# Patient Record
Sex: Female | Born: 1975 | Race: Black or African American | Hispanic: No | Marital: Single | Smoking: Never smoker
Health system: Southern US, Community
[De-identification: ages and names within clinical notes are randomized; demographics above are authoritative.]

## PROBLEM LIST (undated history)

## (undated) DIAGNOSIS — E669 Obesity, unspecified: Secondary | ICD-10-CM

## (undated) DIAGNOSIS — I10 Essential (primary) hypertension: Secondary | ICD-10-CM

## (undated) DIAGNOSIS — E119 Type 2 diabetes mellitus without complications: Secondary | ICD-10-CM

## (undated) DIAGNOSIS — E785 Hyperlipidemia, unspecified: Secondary | ICD-10-CM

## (undated) DIAGNOSIS — M19072 Primary osteoarthritis, left ankle and foot: Secondary | ICD-10-CM

## (undated) HISTORY — PX: CHOLECYSTECTOMY: SHX55

## (undated) HISTORY — DX: Obesity, unspecified: E66.9

## (undated) HISTORY — DX: Primary osteoarthritis, left ankle and foot: M19.072

## (undated) HISTORY — DX: Hyperlipidemia, unspecified: E78.5

## (undated) HISTORY — PX: HERNIA REPAIR: SHX51

## (undated) HISTORY — PX: ANKLE SURGERY: SHX546

---
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
## 1998-02-21 ENCOUNTER — Emergency Department (HOSPITAL_COMMUNITY): Admission: EM | Admit: 1998-02-21 | Discharge: 1998-02-21 | Payer: Self-pay | Admitting: Emergency Medicine

## 1998-02-21 ENCOUNTER — Encounter: Payer: Self-pay | Admitting: Emergency Medicine

## 1998-03-06 ENCOUNTER — Ambulatory Visit (HOSPITAL_COMMUNITY): Admission: RE | Admit: 1998-03-06 | Discharge: 1998-03-07 | Payer: Self-pay

## 1999-09-10 ENCOUNTER — Inpatient Hospital Stay (HOSPITAL_COMMUNITY): Admission: AD | Admit: 1999-09-10 | Discharge: 1999-09-10 | Payer: Self-pay | Admitting: Obstetrics

## 2000-01-04 ENCOUNTER — Emergency Department (HOSPITAL_COMMUNITY): Admission: EM | Admit: 2000-01-04 | Discharge: 2000-01-04 | Payer: Self-pay | Admitting: Emergency Medicine

## 2000-01-04 ENCOUNTER — Encounter: Payer: Self-pay | Admitting: Emergency Medicine

## 2000-03-11 HISTORY — PX: ABDOMINAL EXPLORATION SURGERY: SHX538

## 2000-03-11 HISTORY — PX: TUBAL LIGATION: SHX77

## 2000-10-06 ENCOUNTER — Other Ambulatory Visit: Admission: RE | Admit: 2000-10-06 | Discharge: 2000-10-06 | Payer: Self-pay | Admitting: Obstetrics & Gynecology

## 2000-12-26 ENCOUNTER — Encounter: Admission: RE | Admit: 2000-12-26 | Discharge: 2001-03-26 | Payer: Self-pay | Admitting: Obstetrics and Gynecology

## 2001-01-15 ENCOUNTER — Inpatient Hospital Stay (HOSPITAL_COMMUNITY): Admission: AD | Admit: 2001-01-15 | Discharge: 2001-01-15 | Payer: Self-pay | Admitting: Family Medicine

## 2001-01-16 ENCOUNTER — Inpatient Hospital Stay (HOSPITAL_COMMUNITY): Admission: AD | Admit: 2001-01-16 | Discharge: 2001-01-16 | Payer: Self-pay | Admitting: Obstetrics and Gynecology

## 2001-01-26 ENCOUNTER — Inpatient Hospital Stay (HOSPITAL_COMMUNITY): Admission: AD | Admit: 2001-01-26 | Discharge: 2001-01-29 | Payer: Self-pay | Admitting: Obstetrics & Gynecology

## 2001-03-17 ENCOUNTER — Ambulatory Visit (HOSPITAL_COMMUNITY): Admission: RE | Admit: 2001-03-17 | Discharge: 2001-03-17 | Payer: Self-pay | Admitting: Obstetrics & Gynecology

## 2001-03-20 ENCOUNTER — Encounter: Payer: Self-pay | Admitting: Obstetrics & Gynecology

## 2001-03-21 ENCOUNTER — Inpatient Hospital Stay (HOSPITAL_COMMUNITY): Admission: AD | Admit: 2001-03-21 | Discharge: 2001-03-25 | Payer: Self-pay | Admitting: Obstetrics and Gynecology

## 2001-03-22 ENCOUNTER — Encounter: Payer: Self-pay | Admitting: General Surgery

## 2002-02-25 ENCOUNTER — Emergency Department (HOSPITAL_COMMUNITY): Admission: EM | Admit: 2002-02-25 | Discharge: 2002-02-25 | Payer: Self-pay | Admitting: Emergency Medicine

## 2004-01-25 ENCOUNTER — Emergency Department (HOSPITAL_COMMUNITY): Admission: EM | Admit: 2004-01-25 | Discharge: 2004-01-26 | Payer: Self-pay

## 2004-04-04 ENCOUNTER — Ambulatory Visit: Payer: Self-pay | Admitting: Internal Medicine

## 2004-06-04 ENCOUNTER — Ambulatory Visit: Payer: Self-pay | Admitting: Internal Medicine

## 2004-06-05 ENCOUNTER — Ambulatory Visit: Payer: Self-pay | Admitting: *Deleted

## 2004-07-06 ENCOUNTER — Ambulatory Visit: Payer: Self-pay | Admitting: Internal Medicine

## 2004-07-06 DIAGNOSIS — A5901 Trichomonal vulvovaginitis: Secondary | ICD-10-CM | POA: Insufficient documentation

## 2004-07-06 HISTORY — DX: Trichomonal vulvovaginitis: A59.01

## 2004-07-12 ENCOUNTER — Ambulatory Visit: Payer: Self-pay | Admitting: Internal Medicine

## 2004-10-31 ENCOUNTER — Ambulatory Visit: Payer: Self-pay | Admitting: Internal Medicine

## 2004-12-01 ENCOUNTER — Emergency Department (HOSPITAL_COMMUNITY): Admission: EM | Admit: 2004-12-01 | Discharge: 2004-12-02 | Payer: Self-pay | Admitting: Emergency Medicine

## 2005-07-09 ENCOUNTER — Emergency Department (HOSPITAL_COMMUNITY): Admission: EM | Admit: 2005-07-09 | Discharge: 2005-07-10 | Payer: Self-pay | Admitting: Emergency Medicine

## 2006-02-07 ENCOUNTER — Emergency Department (HOSPITAL_COMMUNITY): Admission: EM | Admit: 2006-02-07 | Discharge: 2006-02-07 | Payer: Self-pay | Admitting: Family Medicine

## 2006-10-28 ENCOUNTER — Emergency Department (HOSPITAL_COMMUNITY): Admission: EM | Admit: 2006-10-28 | Discharge: 2006-10-28 | Payer: Self-pay | Admitting: Emergency Medicine

## 2007-01-14 ENCOUNTER — Emergency Department (HOSPITAL_COMMUNITY): Admission: EM | Admit: 2007-01-14 | Discharge: 2007-01-14 | Payer: Self-pay | Admitting: Emergency Medicine

## 2007-06-22 ENCOUNTER — Emergency Department (HOSPITAL_COMMUNITY): Admission: EM | Admit: 2007-06-22 | Discharge: 2007-06-22 | Payer: Self-pay | Admitting: Emergency Medicine

## 2007-09-01 ENCOUNTER — Emergency Department (HOSPITAL_COMMUNITY): Admission: EM | Admit: 2007-09-01 | Discharge: 2007-09-01 | Payer: Self-pay | Admitting: Emergency Medicine

## 2008-02-23 ENCOUNTER — Ambulatory Visit: Payer: Self-pay | Admitting: Internal Medicine

## 2008-02-23 DIAGNOSIS — R82998 Other abnormal findings in urine: Secondary | ICD-10-CM

## 2008-02-23 DIAGNOSIS — M545 Low back pain, unspecified: Secondary | ICD-10-CM | POA: Insufficient documentation

## 2008-02-23 DIAGNOSIS — F341 Dysthymic disorder: Secondary | ICD-10-CM

## 2008-02-23 DIAGNOSIS — I10 Essential (primary) hypertension: Secondary | ICD-10-CM | POA: Insufficient documentation

## 2008-02-23 HISTORY — DX: Low back pain, unspecified: M54.50

## 2008-02-23 HISTORY — DX: Other abnormal findings in urine: R82.998

## 2008-02-23 HISTORY — DX: Dysthymic disorder: F34.1

## 2008-02-23 LAB — CONVERTED CEMR LAB
Bilirubin Urine: NEGATIVE
Ketones, urine, test strip: NEGATIVE
Nitrite: POSITIVE
pH: 8

## 2008-02-24 ENCOUNTER — Encounter (INDEPENDENT_AMBULATORY_CARE_PROVIDER_SITE_OTHER): Payer: Self-pay | Admitting: Internal Medicine

## 2008-02-26 LAB — CONVERTED CEMR LAB
CO2: 23 meq/L (ref 19–32)
Calcium: 9.6 mg/dL (ref 8.4–10.5)
Sodium: 141 meq/L (ref 135–145)

## 2008-03-08 ENCOUNTER — Ambulatory Visit: Payer: Self-pay | Admitting: Internal Medicine

## 2008-03-08 DIAGNOSIS — R7309 Other abnormal glucose: Secondary | ICD-10-CM | POA: Insufficient documentation

## 2008-03-08 HISTORY — DX: Other abnormal glucose: R73.09

## 2008-03-23 ENCOUNTER — Ambulatory Visit: Payer: Self-pay | Admitting: Internal Medicine

## 2008-04-05 ENCOUNTER — Ambulatory Visit: Payer: Self-pay | Admitting: Internal Medicine

## 2008-04-28 ENCOUNTER — Encounter (INDEPENDENT_AMBULATORY_CARE_PROVIDER_SITE_OTHER): Payer: Self-pay | Admitting: Internal Medicine

## 2008-04-28 ENCOUNTER — Telehealth (INDEPENDENT_AMBULATORY_CARE_PROVIDER_SITE_OTHER): Payer: Self-pay | Admitting: Internal Medicine

## 2008-04-28 ENCOUNTER — Ambulatory Visit: Payer: Self-pay | Admitting: Internal Medicine

## 2008-04-28 DIAGNOSIS — L259 Unspecified contact dermatitis, unspecified cause: Secondary | ICD-10-CM | POA: Insufficient documentation

## 2008-04-28 HISTORY — DX: Unspecified contact dermatitis, unspecified cause: L25.9

## 2008-04-28 LAB — CONVERTED CEMR LAB
KOH Prep: NEGATIVE
Ketones, urine, test strip: NEGATIVE
Nitrite: NEGATIVE
Specific Gravity, Urine: >=1.03
Whiff Test: POSITIVE
pH: 6

## 2008-05-09 ENCOUNTER — Encounter (INDEPENDENT_AMBULATORY_CARE_PROVIDER_SITE_OTHER): Payer: Self-pay | Admitting: Internal Medicine

## 2008-05-09 DIAGNOSIS — D649 Anemia, unspecified: Secondary | ICD-10-CM | POA: Insufficient documentation

## 2008-05-09 HISTORY — DX: Anemia, unspecified: D64.9

## 2008-05-09 LAB — CONVERTED CEMR LAB
ALT: 12 units/L (ref 0–35)
AST: 14 units/L (ref 0–37)
Albumin: 4.4 g/dL (ref 3.5–5.2)
Alkaline Phosphatase: 88 units/L (ref 39–117)
Basophils Absolute: 0 10*3/uL (ref 0.0–0.1)
Basophils Relative: 0 % (ref 0–1)
Calcium: 9.3 mg/dL (ref 8.4–10.5)
Chlamydia, DNA Probe: NEGATIVE
Chloride: 104 meq/L (ref 96–112)
LDL Cholesterol: 71 mg/dL (ref 0–99)
MCHC: 32.6 g/dL (ref 30.0–36.0)
Neutro Abs: 4.6 10*3/uL (ref 1.7–7.7)
Neutrophils Relative %: 59 % (ref 43–77)
Platelets: 361 10*3/uL (ref 150–400)
Potassium: 4.3 meq/L (ref 3.5–5.3)
RBC: 3.94 M/uL (ref 3.87–5.11)
RDW: 14.4 % (ref 11.5–15.5)
Sodium: 140 meq/L (ref 135–145)
Total CHOL/HDL Ratio: 2.7

## 2008-12-22 ENCOUNTER — Ambulatory Visit: Payer: Self-pay | Admitting: Internal Medicine

## 2009-01-03 ENCOUNTER — Ambulatory Visit: Payer: Self-pay | Admitting: Internal Medicine

## 2009-01-20 ENCOUNTER — Ambulatory Visit: Payer: Self-pay | Admitting: Internal Medicine

## 2009-02-18 ENCOUNTER — Inpatient Hospital Stay (HOSPITAL_COMMUNITY): Admission: EM | Admit: 2009-02-18 | Discharge: 2009-02-24 | Payer: Self-pay | Admitting: Emergency Medicine

## 2009-02-22 ENCOUNTER — Encounter: Payer: Self-pay | Admitting: Specialist

## 2009-04-25 ENCOUNTER — Encounter: Admission: RE | Admit: 2009-04-25 | Discharge: 2009-06-20 | Payer: Self-pay | Admitting: Specialist

## 2009-07-06 ENCOUNTER — Encounter: Admission: RE | Admit: 2009-07-06 | Discharge: 2009-09-20 | Payer: Self-pay | Admitting: Specialist

## 2009-10-27 ENCOUNTER — Emergency Department (HOSPITAL_COMMUNITY): Admission: EM | Admit: 2009-10-27 | Discharge: 2009-10-27 | Payer: Self-pay | Admitting: Emergency Medicine

## 2010-04-12 NOTE — Letter (Signed)
Summary: NUTRITION SUMMARY/SUSIE  NUTRITION SUMMARY/SUSIE   Imported By: Arta Bruce 05/08/2009 10:14:42  _____________________________________________________________________  External Attachment:    Type:   Image     Comment:   External Document

## 2010-06-11 LAB — BASIC METABOLIC PANEL
CO2: 29 mEq/L (ref 19–32)
Calcium: 8.7 mg/dL (ref 8.4–10.5)
Chloride: 102 mEq/L (ref 96–112)
GFR calc Af Amer: >60 mL/min (ref >60)
Sodium: 137 mEq/L (ref 135–145)

## 2010-06-11 LAB — CBC
Hemoglobin: 10 g/dL — ABNORMAL LOW (ref 12.0–15.0)
MCHC: 33 g/dL (ref 30.0–36.0)
RBC: 3.44 MIL/uL — ABNORMAL LOW (ref 3.87–5.11)
WBC: 9.7 10*3/uL (ref 4.0–10.5)

## 2010-06-12 LAB — BASIC METABOLIC PANEL
BUN: 2 mg/dL — ABNORMAL LOW (ref 6–23)
CO2: 22 mEq/L (ref 19–32)
CO2: 23 mEq/L (ref 19–32)
Calcium: 8.6 mg/dL (ref 8.4–10.5)
Chloride: 106 mEq/L (ref 96–112)
Chloride: 109 mEq/L (ref 96–112)
Glucose, Bld: 176 mg/dL — ABNORMAL HIGH (ref 70–99)
Potassium: 3.7 mEq/L (ref 3.5–5.1)
Sodium: 139 mEq/L (ref 135–145)

## 2010-06-12 LAB — CBC
HCT: 30.2 % — ABNORMAL LOW (ref 36.0–46.0)
HCT: 34.3 % — ABNORMAL LOW (ref 36.0–46.0)
Hemoglobin: 11.5 g/dL — ABNORMAL LOW (ref 12.0–15.0)
MCHC: 33.6 g/dL (ref 30.0–36.0)
MCHC: 34.1 g/dL (ref 30.0–36.0)
MCV: 87.5 fL (ref 78.0–100.0)
MCV: 88.8 fL (ref 78.0–100.0)
RBC: 3.45 MIL/uL — ABNORMAL LOW (ref 3.87–5.11)
RBC: 3.86 MIL/uL — ABNORMAL LOW (ref 3.87–5.11)
RDW: 14.6 % (ref 11.5–15.5)
WBC: 8.8 10*3/uL (ref 4.0–10.5)

## 2010-06-12 LAB — DIFFERENTIAL
Basophils Absolute: 0 10*3/uL (ref 0.0–0.1)
Basophils Relative: 0 % (ref 0–1)
Eosinophils Absolute: 0 10*3/uL (ref 0.0–0.7)
Eosinophils Relative: 0 % (ref 0–5)
Monocytes Absolute: 0.9 10*3/uL (ref 0.1–1.0)
Monocytes Relative: 5 % (ref 3–12)
Neutro Abs: 15.1 10*3/uL — ABNORMAL HIGH (ref 1.7–7.7)

## 2010-06-12 LAB — GLUCOSE, CAPILLARY

## 2010-07-27 NOTE — Discharge Summary (Signed)
[facility]  Patient:    Jessica Herman, Jessica Herman Visit Number: [id number] MRN: [id number]          Service Type: OBS Location: 910A 9146 01 Attending Physician:  Lars Pinks Dictated by:   Gerrit Friends. Aldona Bar, M.D. Admit Date:  01/26/2001 Disc. Date: 01/29/01                             Discharge Summary  DISCHARGE DIAGNOSES:          1. A 34 to 35-week intrauterine pregnancy,                                  delivered 7-pound 14-ounce female infant,                                  Apgars 4 and 8.                               2. Blood type O positive.                               3. Previous cesarean section--vaginal birth                                  after cesarean.                               4. Shoulder dystocia.                               5. Mild preeclampsia.  PROCEDURES:                   Normal spontaneous delivery.  SUMMARY:                      This 35 year old, gravida 5, para 2, with a due date of December 25 was admitted in early labor after a pregnancy complicated by mild preeclampsia. She had a previous cesarean section at [redacted] weeks gestation for a 4-pound 9-ounce baby after a failed induction for severe preeclampsia. She requested a trial of labor, and after risks and benefits were discussed, agreed to this. During this pregnancy, she also has had gestational diabetes, diet controlled.  At the time of admission, she was in early labor. She was 1 to 2 cm dilated, 90% effaced with the vertex at -2 station. Amniotomy was carried out with production of clear fluid and she had a fairly rapid progression into the second stage of labor, which also went fairly quickly. Unfortunately, she had a severe shoulder dystocia--the anterior shoulder was fixed well into the pubis. The posterior (left arm) was delivered and then the posterior shoulder was rotated so that the right shoulder delivered posteriorly as well. Pediatrics was in  attendance. Apgars were noted to be 4 and 8. The infant was large for gestational age. There were no lacerations. Subsequent weight was found to be 7 pounds 14 ounces.  Postpartum, mother did well. On the  morning of November 21, she was ambulating well, tolerating a regular diet well, having normal bowel and bladder function, was afebrile, and was deemed ready for discharge.  Her hemoglobin was 8.2, white count 13,900 and platelet count 288,000. She was not breast-feeding. The baby was doing fairly well in the nursery. Accordingly, mother was discharged from hospital after being given all appropriate instructions.  DISCHARGE MEDICATIONS:        1. Motrin 600 mg every six hours for cramping.                               2. Tylox one to two every four to six hours for                                  more severe pain.                               3. Ferrous sulfate 300 mg twice daily.  DISCHARGE FOLLOWUP:           The patient will return to the office for followup in approximately four weeks time.  CONDITION ON DISCHARGE:       Improved. Dictated by:   Gerrit Friends. Aldona Bar, M.D. Attending Physician:  Lars Pinks DD:  01/29/01 TD:  01/29/01 Job: [id number]

## 2010-07-27 NOTE — Discharge Summary (Signed)
[facility]  Patient:    Jessica Herman, Jessica Herman Visit Number: [id number] MRN: [id number]          Service Type: MED Location: 9300 [id number] 01 Attending Physician:  Miguel Aschoff Dictated by:   Caralyn Guile. Arlyce Dice, M.D. Admit Date:  03/20/2001 Discharge Date: 03/25/2001   CC:         Vikki Ports, M.D.   Discharge Summary  FINAL DIAGNOSES:              1. Hemoperitoneum.                               2. Liver laceration.  SECONDARY DIAGNOSIS:          None.  PROCEDURE:                    Exploratory laparotomy with suture of liver                               laceration.  COMPLICATIONS:                None.  CONDITION ON DISCHARGE:       Improved.  HISTORY/HOSPITAL COURSE:      This is a 35 year old G5, P3, AB2 who underwent a laparoscopic bilateral tubal cautery on March 17, 2001 which was 72 hours prior to admission.  The patient had done well after the surgery but noted severe and sudden onset of left upper quadrant pain that brought her to the emergency room.  Of note during the laparoscopy, because of history of umbilical hernia, the primary entry point was in the left upper quadrant during the insertion of the Verres needle and a 5.0 mm trocar.  The patient was observed in the hospital with the diagnosis of hemoperitoneum made on CAT scan.  She appeared to be hemodynamically stable and, therefore, was not taken immediately to the emergency room.  However, early in the morning of March 21, 2001, it was clear that she was becoming hemodynamically unstable with decreasing urine output.  She was taken to the operating room by Dr. Luan Pulling, general surgeon, and with the assistance of Dr. Ilda Mori. Exploratory laparotomy was performed with evacuation hemoperitoneum. Hepatorraphy was performed as well as umbilical hernia repair with mesh.  The procedure went without complication.  The patients postoperative course was benign.   During the hospitalization, she received four units of blood.  On postoperative day #4, the patient was ambulating well.  Her pain was controlled with oral pain medication.  She was on a regular diet, passing gas and having bowel movements.  She was, therefore, felt to be ready for discharge.  She was discharged on a regular diet and told to limit her activity.  She was given Tylox 30 tablets to take one to two every 4 hours for pain.  She was asked to return to the office in five days for suture removal.  LABORATORY DATA:              Laboratory data revealed an admission hemoglobin of 9.3 and white blood cell count of 26,000.  Her follow up hemoglobin during the night had dropped to 7.5 and white blood cell count also dropped to 14,000.  Her hemoglobin dropped further during the first night of observation to 6.5 and she received two units of blood.  In surgery,  her hemoglobin was 6.8.  She was transfused another two units of blood and her postoperative hemoglobin was 10.4 and stabilized in the 9.2 range.  Her white blood cell count on discharge was 11.1.  Liver functions were performed prior to discharge which were within normal limits except for mild decreases in total protein and albumin.  A CAT scan was performed which showed hemoperitoneum with a hematoma in the omentum center to the left of the umbilicus with no evidence of bowel injury.  A chest x-ray was performed which showed some mild cardiac enlargement, pulmonary vascular congestion but otherwise was clear.  A flat plate had been done prior to the CAT scan which showed question of constipation.  No acute abnormality was seen. Dictated by:   Caralyn Guile Arlyce Dice, M.D. Attending Physician:  Miguel Aschoff DD:  04/04/01 TD:  04/05/01 Job: [id number]

## 2010-07-27 NOTE — Op Note (Signed)
[facility]  Patient:    Jessica Herman, Jessica Herman Visit Number: [id number] MRN: [id number]          Service Type: DSU Location: [facility] Attending Physician:  Lars Pinks Dictated by:   Caralyn Guile. Arlyce Dice, M.D. Proc. Date: 03/17/01 Admit Date:  03/17/2001                             Operative Report  PREOPERATIVE DIAGNOSIS:       Voluntary sterilization.  POSTOPERATIVE DIAGNOSIS:      Voluntary sterilization.  OPERATION:                    Laparoscopic bilateral tubal cautery for                               sterilization.  SURGEON:                      Richard D. Arlyce Dice, M.D.  ANESTHESIA:                   General endotracheal.  ESTIMATED BLOOD LOSS:         5 cc.  FINDINGS:                     Normal-appearing tubes and ovaries, omental adhesions beneath the umbilicus.  INDICATIONS:                  This is a 36 year old gravida 5, para 3, who is six weeks status post spontaneous vaginal delivery who request permanent sterilization.  The permanence of the procedure was discussed with the patient as well as the fact that young women even with multiple births are more likely to regret their decision.  Alternative forms of birth control were discussed with the patient and also the fact that the method had a 2 per 1000 failure risk.  With all of this information, the patient elected to proceed with sterilization.  In addition, the patient had previous umbilical hernia repair, and previous laparoscopic cholecystectomy.  DESCRIPTION OF PROCEDURE:     The patient was taken to the operating room, placed in the supine position, and general endotracheal anesthesia was induced.  She was then placed in the modified dorsolithotomy position, and the abdomen, umbilicus, and vagina were prepped and draped in a sterile fashion. A Hulka tenaculum was placed through the cervix and affixed to the anterior cervical wall for uterine manipulation, the bladder  was catheterization.  The surgeon then regowned and gloved.  Because the patient had previous surgery at the umbilical site, the decision was made to enter the abdomen in the left upper quadrant.  An incision was made near the insertion on the rectus muscle and the lower ribs in the midclavicular line on the left.  The Veress needle was introduced into the peritoneum and pneumoperitoneum was created.  A 5 mm port was then placed in the left upper quadrant and under visualization, the umbilical area was viewed, and noted to have adhesions of omental tissue.  No bowel appeared to be adherent at that point.  The scope was long enough to view the pelvis.  The decision was made not to place another port in the umbilicus, but proceeded directly to tubal ligation.  A suprapubic port was placed under direct visualization, and the Klepinger forceps were introduced  into the peritoneal cavity.  The tubes were identified at the _______ junction, and cauterized along a 4 cm length bilaterally.  Cautery was carried out until no current was seen flowing through the tubal tissue.  At this point, the scope was removed from the upper port, and placed in the lower port so that the upper port insertion site could be visualized, and this was perfectly clear of adhesions.  The upper port was then removed.  The gas was allowed to escape through the lower 5 mm port and then that port was removed.  Then, both incisions were closed with Steri-Strips.  The patient tolerated the procedure well and left the operating room in good condition. Dictated by:   Caralyn Guile Arlyce Dice, M.D. Attending Physician:  Lars Pinks DD:  03/17/01 TD:  03/17/01 Job: [id number]

## 2010-07-27 NOTE — Op Note (Signed)
[facility]  Patient:    TAVI, GAUGHRAN Visit Number: [id number] MRN: [id number]          Service Type: OBS Location: 9400 9181 01 Attending Physician:  Miguel Aschoff Dictated by:   Vikki Ports, M.D. Proc. Date: 03/21/01 Admit Date:  03/20/2001                             Operative Report  PREOPERATIVE DIAGNOSIS:       Hemoperitoneum, status post bilateral tubal                               ligation.  POSTOPERATIVE DIAGNOSIS:      Hemoperitoneum, status post bilateral tubal                               ligation with left lateral segment liver                               laceration and umbilical hernia.  OPERATION:                    Exploratory laparotomy, evacuation of                               hemoperitoneum, hepatorrhaphy, umbilical hernia                               repair with mesh.  SURGEON:                      Vikki Ports, M.D.  ASSISTANT:                    Caralyn Guile. Arlyce Dice, M.D.  ANESTHESIA:                   General.  DESCRIPTION OF PROCEDURE:     The patient was taken to the operating room and placed in the supine position.  After adequate anesthesia was induced using endotracheal tube, the abdomen was prepped and draped in a normal sterile fashion.  Using a vertical midline incision, I dissected down to the fascia. Supraumbilically, the fascia was opened.  There was a large amount of intra-abdominal blood which was evacuated.  There was also significant clot, both in the upper midline as well as in the pelvis.  This was all evacuated. The abdomen was inspected.  There appeared to be a laceration in the anterior surface of the left lateral segment, measuring about 4 mm in diameter.  There was significant capsular hematoma, and a rupture of the capsule.  There was some free moderate amount of bleeding.  Hepatorrhaphy was performed with a blunt needle 0 Vicryl suture around the laceration.  A piece  of Surgicel was then placed over the repair and left in place with a pack.  The remainder of the abdomen was then copiously irrigated and inspected.  No other bleeding sites were found.  The abdominal packs were removed.  Hemostasis was evident at the liver.  The fascia was closed with a running #1 Novofil.  Flaps were fashioned near the umbilical hernia.  The fascia was closed primarily at the umbilical hernia site, and a piece of 6 x 6 polyester sofradim mesh was placed over the repair.  A 19 Blake drain was placed within the hernia repair flaps and the skin was closed with staples.  The incision was injected with 0.5 Marcaine.  The patient tolerated the procedure well and went to PACU and then the ICU in serious condition. Dictated by:   Vikki Ports, M.D. Attending Physician:  Miguel Aschoff DD:  03/21/01 TD:  03/22/01 Job: [id number]

## 2011-04-10 ENCOUNTER — Emergency Department (HOSPITAL_COMMUNITY)
Admission: EM | Admit: 2011-04-10 | Discharge: 2011-04-10 | Disposition: A | Discharge status: Home / Self Care | Payer: Self-pay | Attending: Emergency Medicine | Admitting: Emergency Medicine

## 2011-04-10 ENCOUNTER — Encounter (HOSPITAL_COMMUNITY): Payer: Self-pay | Admitting: *Deleted

## 2011-04-10 DIAGNOSIS — J3489 Other specified disorders of nose and nasal sinuses: Secondary | ICD-10-CM | POA: Insufficient documentation

## 2011-04-10 DIAGNOSIS — I10 Essential (primary) hypertension: Secondary | ICD-10-CM | POA: Insufficient documentation

## 2011-04-10 DIAGNOSIS — R51 Headache: Secondary | ICD-10-CM | POA: Insufficient documentation

## 2011-04-10 HISTORY — DX: Essential (primary) hypertension: I10

## 2011-04-10 MED ORDER — METOCLOPRAMIDE HCL 5 MG/ML IJ SOLN
10.0000 mg | Freq: Once | INTRAMUSCULAR | Status: AC
  Administered 2011-04-10: 10 mg via INTRAMUSCULAR
  Filled 2011-04-10: qty 2

## 2011-04-10 MED ORDER — AMLODIPINE BESYLATE 10 MG PO TABS: 10.0000 mg | ORAL_TABLET | Freq: Every day | ORAL | Status: DC

## 2011-04-10 NOTE — ED Notes (Signed)
Pt states she has had a headache all day, eyes hurt, sides of head, some photophobia noted, denies vomiting, pt was on Norvasc and has run out of meds, ran out yesterday

## 2011-04-10 NOTE — ED Notes (Signed)
Patient stable upon discharge . Denies any blurred vision, dizziness, headache

## 2011-04-10 NOTE — ED Provider Notes (Signed)
History     CSN: [id number]  Arrival date & time 04/10/11  1811   First MD Initiated Contact with Patient 04/10/11 1908      Chief Complaint  Patient presents with  . Headache    (Consider location/radiation/quality/duration/timing/severity/associated sxs/prior treatment) HPI History provided by pt.   Pt has had a constant, bilateral headache x 3 days.  Describes as pressure and/or squeezing of her head.  No associated fever, photophobia, dizziness, N/V,  vision changes or rash.  Has had nasal congestion and rhinorrhea but no sore throat or cough.  No trauma.  No prior h/o migraines.  Has taken ibuprofen with some relief.  Also c/o running out of anti-hypertensives yesterday.   Past Medical History  Diagnosis Date  . Hypertension     Past Surgical History  Procedure Date  . Ankle surgery     No family history on file.  History  Substance Use Topics  . Smoking status: Never Smoker   . Smokeless tobacco: Not on file  . Alcohol Use: No    OB History    Grav Para Term Preterm Abortions TAB SAB Ect Mult Living                  Review of Systems  All other systems reviewed and are negative.    Allergies  Review of patient's allergies indicates no known allergies.  Home Medications   Current Outpatient Rx  Name Route Sig Dispense Refill  . IBUPROFEN 800 MG PO TABS Oral Take 800 mg by mouth every 8 (eight) hours as needed. pain      BP 160/95  Pulse 80  Temp(Src) 98.7 F (37.1 C) (Oral)  Resp 18  SpO2 100%  LMP 04/02/2011  Physical Exam  Nursing note and vitals reviewed. Constitutional: She is oriented to person, place, and time. She appears well-developed and well-nourished. No distress.       Pt sitting up on edge of bed watching TV  HENT:  Head: Normocephalic and atraumatic.       No sinus ttp.  Nasal congestion.  Eyes:       Normal appearance  Neck: Normal range of motion.  Cardiovascular: Normal rate and regular rhythm.        Hypertensive at  160/95.  Pulmonary/Chest: Effort normal and breath sounds normal.  Musculoskeletal: Normal range of motion.  Neurological: She is alert and oriented to person, place, and time. She has normal reflexes. No cranial nerve deficit or sensory deficit. She displays a negative Romberg sign. Coordination and gait normal.       5/5 and equal upper and lower extremity strength.  No past pointing.  No pronator drift.    Skin: Skin is warm and dry. No rash noted.  Psychiatric: She has a normal mood and affect. Her behavior is normal.    ED Course  Procedures (including critical care time)  Labs Reviewed - No data to display No results found.   1. Headache       MDM  Pt presents w/ non-traumatic bilateral head pressure x 4 days.  Pt looks well and no fever, meningeal signs or focal neuro deficits on exam.  Pt received 10mg  IM reglan and pain resolved.  Pain may be related to mild sinusitis but is more likely a tension headache.  Recommended ibuprofen and/or tylenol.  Refilled Norvasc which she ran out of yesterday and recommended close PCP f/u for HTN.  Return precautions discussed.  Otilio Miu, PA 04/11/11 0157

## 2011-04-12 NOTE — ED Provider Notes (Signed)
Medical screening examination/treatment/procedure(s) were performed by non-physician practitioner and as supervising physician I was immediately available for consultation/collaboration.   Teah Votaw, MD 04/12/11 0714 

## 2012-07-09 ENCOUNTER — Emergency Department (HOSPITAL_COMMUNITY): Payer: BC Managed Care – PPO

## 2012-07-09 ENCOUNTER — Encounter (HOSPITAL_COMMUNITY): Payer: Self-pay | Admitting: Unknown Physician Specialty

## 2012-07-09 ENCOUNTER — Emergency Department (HOSPITAL_COMMUNITY)
Admission: EM | Admit: 2012-07-09 | Discharge: 2012-07-09 | Disposition: A | Discharge status: Home / Self Care | Payer: BC Managed Care – PPO | Attending: Emergency Medicine | Admitting: Emergency Medicine

## 2012-07-09 DIAGNOSIS — R350 Frequency of micturition: Secondary | ICD-10-CM | POA: Insufficient documentation

## 2012-07-09 DIAGNOSIS — R112 Nausea with vomiting, unspecified: Secondary | ICD-10-CM | POA: Insufficient documentation

## 2012-07-09 DIAGNOSIS — M25579 Pain in unspecified ankle and joints of unspecified foot: Secondary | ICD-10-CM | POA: Insufficient documentation

## 2012-07-09 DIAGNOSIS — N898 Other specified noninflammatory disorders of vagina: Secondary | ICD-10-CM | POA: Insufficient documentation

## 2012-07-09 DIAGNOSIS — R739 Hyperglycemia, unspecified: Secondary | ICD-10-CM

## 2012-07-09 DIAGNOSIS — I10 Essential (primary) hypertension: Secondary | ICD-10-CM | POA: Insufficient documentation

## 2012-07-09 DIAGNOSIS — R7309 Other abnormal glucose: Secondary | ICD-10-CM | POA: Insufficient documentation

## 2012-07-09 DIAGNOSIS — Z87442 Personal history of urinary calculi: Secondary | ICD-10-CM | POA: Insufficient documentation

## 2012-07-09 DIAGNOSIS — N390 Urinary tract infection, site not specified: Secondary | ICD-10-CM

## 2012-07-09 DIAGNOSIS — M25572 Pain in left ankle and joints of left foot: Secondary | ICD-10-CM

## 2012-07-09 LAB — CBC WITH DIFFERENTIAL/PLATELET
Basophils Absolute: 0.1 10*3/uL (ref 0.0–0.1)
HCT: 34.2 % — ABNORMAL LOW (ref 36.0–46.0)
Hemoglobin: 11.9 g/dL — ABNORMAL LOW (ref 12.0–15.0)
Lymphocytes Relative: 34 % (ref 12–46)
Monocytes Absolute: 0.7 10*3/uL (ref 0.1–1.0)
Monocytes Relative: 7 % (ref 3–12)
Neutro Abs: 5.8 10*3/uL (ref 1.7–7.7)
RBC: 4.22 MIL/uL (ref 3.87–5.11)
RDW: 13.2 % (ref 11.5–15.5)
WBC: 10.1 10*3/uL (ref 4.0–10.5)

## 2012-07-09 LAB — URINALYSIS, ROUTINE W REFLEX MICROSCOPIC
Glucose, UA: >1000 mg/dL — AB
Leukocytes, UA: NEGATIVE
Specific Gravity, Urine: 1.04 — ABNORMAL HIGH (ref 1.005–1.030)
pH: 5.5 (ref 5.0–8.0)

## 2012-07-09 LAB — BASIC METABOLIC PANEL
CO2: 26 mEq/L (ref 19–32)
Chloride: 102 mEq/L (ref 96–112)
Sodium: 138 mEq/L (ref 135–145)

## 2012-07-09 LAB — PREGNANCY, URINE: Preg Test, Ur: NEGATIVE

## 2012-07-09 LAB — URINE MICROSCOPIC-ADD ON

## 2012-07-09 MED ORDER — METFORMIN HCL 500 MG PO TABS: 500.0000 mg | ORAL_TABLET | Freq: Two times a day (BID) | ORAL | Status: DC

## 2012-07-09 MED ORDER — MORPHINE SULFATE 4 MG/ML IJ SOLN: 4.0000 mg | Freq: Once | INTRAMUSCULAR | Status: DC

## 2012-07-09 MED ORDER — ONDANSETRON HCL 4 MG/2ML IJ SOLN: 4.0000 mg | Freq: Once | INTRAMUSCULAR | Status: DC

## 2012-07-09 MED ORDER — NITROFURANTOIN MONOHYD MACRO 100 MG PO CAPS: 100.0000 mg | ORAL_CAPSULE | Freq: Two times a day (BID) | ORAL | Status: DC

## 2012-07-09 MED ORDER — SODIUM CHLORIDE 0.9 % IV BOLUS (SEPSIS): 1000.0000 mL | Freq: Once | INTRAVENOUS | Status: DC

## 2012-07-09 NOTE — ED Notes (Signed)
Per pt " I just passed two clots and that has completely eased the pain." PA at bedside. IV canceled. Pain denies need for pain rx at this time. Pelvic completed.

## 2012-07-09 NOTE — ED Provider Notes (Signed)
History     CSN: [id number]  Arrival date & time 07/09/12  1326   First MD Initiated Contact with Patient 07/09/12 1331      No chief complaint on file.   (Consider location/radiation/quality/duration/timing/severity/associated sxs/prior treatment) HPI Comments: Patient presents with a chief complaint of right flank pain.  Pain radiating into the right groin.  Pain has been intermittent since last evening.  She reports that that the pain has been constant, but the pain does get worse at times and then will improve.  She began her menstrual cycle yesterday.  She denies any history of Kidney Stones.  She denies dysuria, increased urinary frequency, difficulty urinating, hematuria, fever, or chills.  She reports that she did have two episodes of vomiting last evening.  Denies diarrhea.  No blood in her emesis.  PMH significnant for Cholecystectomy.    Patient also reports that she is having pain of her left ankle.  Pain has been present for years.  She had Surgery performed by Dr. Shelle Iron back in 2008.  She denies any new injury.  Denies swelling, erythema, or warmth of the ankle.  No decrease in ROM.  Patient is a 37 y.o. female presenting with flank pain. The history is provided by the patient.  Flank Pain Associated symptoms include nausea and vomiting. Pertinent negatives include no chills, fever, headaches or urinary symptoms. Nothing aggravates the symptoms. She has tried NSAIDs for the symptoms. The treatment provided no relief.    Past Medical History  Diagnosis Date  . Hypertension     Past Surgical History  Procedure Laterality Date  . Ankle surgery      No family history on file.  History  Substance Use Topics  . Smoking status: Never Smoker   . Smokeless tobacco: Not on file  . Alcohol Use: No    OB History   Grav Para Term Preterm Abortions TAB SAB Ect Mult Living                  Review of Systems  Constitutional: Negative for fever and chills.  Eyes: Negative  for blurred vision.  Gastrointestinal: Positive for nausea and vomiting. Negative for diarrhea and constipation.  Endocrine: Negative for polydipsia.  Genitourinary: Positive for frequency and vaginal bleeding. Negative for dysuria, urgency, hematuria, flank pain, vaginal discharge and difficulty urinating.  Neurological: Negative for headaches.  All other systems reviewed and are negative.  Review of Systems  Constitutional: Negative for fever and chills.  Eyes: Negative for blurred vision.  Gastrointestinal: Positive for nausea and vomiting. Negative for diarrhea and constipation.  Genitourinary: Positive for frequency and vaginal bleeding. Negative for dysuria, urgency, hematuria, flank pain, vaginal discharge and difficulty urinating.  Neurological: Negative for headaches.  Endo/Heme/Allergies: Negative for polydipsia.  All other systems reviewed and are negative.    Allergies  Review of patient's allergies indicates no known allergies.  Home Medications   Current Outpatient Rx  Name  Route  Sig  Dispense  Refill  . EXPIRED: amLODipine (NORVASC) 10 MG tablet   Oral   Take 1 tablet (10 mg total) by mouth daily.   30 tablet   0   . ibuprofen (ADVIL,MOTRIN) 800 MG tablet   Oral   Take 800 mg by mouth every 8 (eight) hours as needed. pain           BP 178/101  Pulse 62  Temp(Src) 97.5 F (36.4 C) (Oral)  Resp 14  SpO2 100%  Physical Exam  Nursing note  and vitals reviewed. Constitutional: She appears well-developed and well-nourished. No distress.  HENT:  Head: Normocephalic and atraumatic.  Mouth/Throat: Oropharynx is clear and moist.  Neck: Normal range of motion. Neck supple.  Cardiovascular: Normal rate, regular rhythm and normal heart sounds.   Pulmonary/Chest: Effort normal and breath sounds normal.  Abdominal: Soft. Bowel sounds are normal. She exhibits no distension and no mass. There is no tenderness. There is no rebound, no guarding and no CVA  tenderness.  Genitourinary: Cervix exhibits no motion tenderness. Right adnexum displays no mass, no tenderness and no fullness. Left adnexum displays no mass, no tenderness and no fullness.  Musculoskeletal:       Left ankle: She exhibits normal range of motion and no swelling.  No erythema, edema, or warmth of the left ankle  Neurological: She is alert.  Skin: Skin is warm and dry. She is not diaphoretic.  Psychiatric: She has a normal mood and affect.    ED Course  Procedures (including critical care time)  Labs Reviewed - No data to display Ct Abdomen Pelvis Wo Contrast  07/09/2012  *RADIOLOGY REPORT*  Clinical Data: Right flank pain  CT ABDOMEN AND PELVIS WITHOUT CONTRAST  Technique:  Multidetector CT imaging of the abdomen and pelvis was performed following the standard protocol without intravenous contrast.  Comparison: 01/25/2004  Findings: Sagittal images of the spine are unremarkable.  No destructive bony lesions are noted.  Lung bases are unremarkable. Unenhanced liver, spleen, pancreas and adrenals are unremarkable. Status post cholecystectomy.  No aortic aneurysm.  Unenhanced kidneys are symmetrical in size.  No nephrolithiasis.  No hydronephrosis or hydroureter.  Bilateral calcified ureteral calculi are noted.  There are post hernia repair changes midline lower abdominal wall.  No small bowel obstruction.  No ascites or free air.  No adenopathy.  No pericecal inflammation.  Normal appendix clearly visualized axial image 56.  The uterus and adnexa are unremarkable.  The urinary bladder is unremarkable.  Bilateral distal ureter is unremarkable.  IMPRESSION:  1.  No nephrolithiasis.  No hydronephrosis or hydroureter. 2.  Normal appendix is clearly visualized. 3.  Status post cholecystectomy. 4.  No calcified ureteral calculi are noted.   Original Report Authenticated By: Natasha Mead, M.D.      No diagnosis found.  3:30 PM Patient reports that her pain has significantly improved at this  time.  She has not yet received her pain medication.  She reports that she does not want any pain medications at this time.  MDM  Patient presents with a chief complaint of abdominal pain.  She reports that the pain has been intermittent.  Pain primarily in the right groin area.  Labs unremarkable.  CT ab/pelvis not showing any evidence of kidney stone and showing a normal appendix.  No pain with pelvic exam.  Urine pregnancy negative.  UA showing UTI.  Patient discharged home on antibiotic.  Urine sent for culture.  Patient also found to have a blood sugar of 291.  No prior history of DM.  Anion gap=10.  No evidence of DKA.  Patient started on Metformin and instructed to follow up with PCP.  Return precautions given.        Pascal Lux [place], PA-C 07/09/12 [phone]

## 2012-07-09 NOTE — ED Notes (Signed)
Patient arrived via POV with Right flank pain and emesis since last night. She states she started her menstrual cycle yesterday. Denies fever, chills or diarrhea.

## 2012-07-10 LAB — WET PREP, GENITAL
Trich, Wet Prep: NONE SEEN
Yeast Wet Prep HPF POC: NONE SEEN

## 2012-07-11 LAB — URINE CULTURE: Colony Count: >=100000

## 2012-07-12 ENCOUNTER — Telehealth (HOSPITAL_COMMUNITY): Payer: Self-pay | Admitting: Emergency Medicine

## 2012-07-12 NOTE — ED Notes (Signed)
+  Urine. Patient treated with Macrobid. Sensitive to same. Per protocol MD. °

## 2012-07-12 NOTE — ED Notes (Signed)
Patient has +Urine culture. °

## 2012-07-14 NOTE — ED Provider Notes (Signed)
Medical screening examination/treatment/procedure(s) were performed by non-physician practitioner and as supervising physician I was immediately available for consultation/collaboration.  Gilda Crease, MD 07/14/12 [phone]

## 2012-10-21 ENCOUNTER — Emergency Department (HOSPITAL_COMMUNITY)
Admission: EM | Admit: 2012-10-21 | Discharge: 2012-10-21 | Disposition: A | Discharge status: Home / Self Care | Payer: BC Managed Care – PPO | Attending: Emergency Medicine | Admitting: Emergency Medicine

## 2012-10-21 ENCOUNTER — Encounter (HOSPITAL_COMMUNITY): Payer: Self-pay

## 2012-10-21 DIAGNOSIS — Z79899 Other long term (current) drug therapy: Secondary | ICD-10-CM | POA: Insufficient documentation

## 2012-10-21 DIAGNOSIS — R110 Nausea: Secondary | ICD-10-CM | POA: Insufficient documentation

## 2012-10-21 DIAGNOSIS — E1169 Type 2 diabetes mellitus with other specified complication: Secondary | ICD-10-CM | POA: Insufficient documentation

## 2012-10-21 DIAGNOSIS — I10 Essential (primary) hypertension: Secondary | ICD-10-CM | POA: Insufficient documentation

## 2012-10-21 DIAGNOSIS — R42 Dizziness and giddiness: Secondary | ICD-10-CM | POA: Insufficient documentation

## 2012-10-21 DIAGNOSIS — Z3202 Encounter for pregnancy test, result negative: Secondary | ICD-10-CM | POA: Insufficient documentation

## 2012-10-21 DIAGNOSIS — R739 Hyperglycemia, unspecified: Secondary | ICD-10-CM

## 2012-10-21 HISTORY — DX: Type 2 diabetes mellitus without complications: E11.9

## 2012-10-21 LAB — URINALYSIS, ROUTINE W REFLEX MICROSCOPIC
Bilirubin Urine: NEGATIVE
Glucose, UA: >1000 mg/dL — AB
Nitrite: NEGATIVE
Specific Gravity, Urine: 1.022 (ref 1.005–1.030)
pH: 5 (ref 5.0–8.0)

## 2012-10-21 LAB — CBC WITH DIFFERENTIAL/PLATELET
Eosinophils Absolute: 0.1 10*3/uL (ref 0.0–0.7)
Eosinophils Relative: 1 % (ref 0–5)
Lymphocytes Relative: 25 % (ref 12–46)
MCHC: 34.2 g/dL (ref 30.0–36.0)
MCV: 82.4 fL (ref 78.0–100.0)
Platelets: 379 10*3/uL (ref 150–400)
RDW: 12.9 % (ref 11.5–15.5)
WBC: 8.4 10*3/uL (ref 4.0–10.5)

## 2012-10-21 LAB — BASIC METABOLIC PANEL
BUN: 6 mg/dL (ref 6–23)
CO2: 27 mEq/L (ref 19–32)
Chloride: 99 mEq/L (ref 96–112)
Glucose, Bld: 305 mg/dL — ABNORMAL HIGH (ref 70–99)
Potassium: 4.1 mEq/L (ref 3.5–5.1)

## 2012-10-21 LAB — URINE MICROSCOPIC-ADD ON

## 2012-10-21 MED ORDER — MECLIZINE HCL 50 MG PO TABS: 25.0000 mg | ORAL_TABLET | Freq: Three times a day (TID) | ORAL | Status: DC | PRN

## 2012-10-21 MED ORDER — MECLIZINE HCL 25 MG PO TABS
25.0000 mg | ORAL_TABLET | Freq: Once | ORAL | Status: AC
  Administered 2012-10-21: 25 mg via ORAL
  Filled 2012-10-21: qty 1

## 2012-10-21 MED ORDER — LORAZEPAM 0.5 MG PO TABS
0.5000 mg | ORAL_TABLET | Freq: Once | ORAL | Status: AC
  Administered 2012-10-21: 0.5 mg via ORAL
  Filled 2012-10-21: qty 1

## 2012-10-21 MED ORDER — LORAZEPAM 1 MG PO TABS: 1.0000 mg | ORAL_TABLET | Freq: Three times a day (TID) | ORAL | Status: DC | PRN

## 2012-10-21 NOTE — ED Notes (Addendum)
EKG to Donnetta Hutching, MD.  No prev EKGs on record

## 2012-10-21 NOTE — ED Provider Notes (Addendum)
CSN: [id number]     Arrival date & time 10/21/12  0865 History     First MD Initiated Contact with Patient 10/21/12 [phone]     Chief Complaint  Patient presents with  . Nausea  . Dizziness   (Consider location/radiation/quality/duration/timing/severity/associated sxs/prior Treatment) HPI..... flulike symptoms for the past 2 days. Now feels dizzy and nauseated.  Patient has taken over-the-counter medicines with minimal relief. No gross neurological deficits, stiff neck, fever, chills.  Certain movements and positions make the symptoms worse. Severity is mild to moderate. Patient is diabetic with poor glucose control for  Past Medical History  Diagnosis Date  . Hypertension   . Diabetes mellitus without complication    Past Surgical History  Procedure Laterality Date  . Ankle surgery    . Cholecystectomy    . Hernia repair     History reviewed. No pertinent family history. History  Substance Use Topics  . Smoking status: Never Smoker   . Smokeless tobacco: Never Used  . Alcohol Use: No   OB History   Grav Para Term Preterm Abortions TAB SAB Ect Mult Living                 Review of Systems  All other systems reviewed and are negative.    Allergies  Review of patient's allergies indicates no known allergies.  Home Medications   Current Outpatient Rx  Name  Route  Sig  Dispense  Refill  . ibuprofen (ADVIL,MOTRIN) 200 MG tablet   Oral   Take 600 mg by mouth every 6 (six) hours as needed for pain.         . metFORMIN (GLUCOPHAGE) 500 MG tablet   Oral   Take 1 tablet (500 mg total) by mouth 2 (two) times daily with a meal.   60 tablet   0    BP 152/88  Pulse 64  Temp(Src) 98.5 F (36.9 C) (Oral)  Resp 16  Ht 5\' 6"  (1.676 m)  Wt 216 lb (97.977 kg)  BMI 34.88 kg/m2  SpO2 100%  LMP 10/20/2012 Physical Exam  Nursing note and vitals reviewed. Constitutional: She is oriented to person, place, and time. She appears well-developed and well-nourished.  HENT:   Head: Normocephalic and atraumatic.  Eyes: Conjunctivae and EOM are normal. Pupils are equal, round, and reactive to light.  Neck: Normal range of motion. Neck supple.  Cardiovascular: Normal rate, regular rhythm and normal heart sounds.   Pulmonary/Chest: Effort normal and breath sounds normal.  Abdominal: Soft. Bowel sounds are normal.  Musculoskeletal: Normal range of motion.  Neurological: She is alert and oriented to person, place, and time.  No gross neurological deficits.  Skin: Skin is warm and dry.  Psychiatric: She has a normal mood and affect.    ED Course   Procedures (including critical care time)  Labs Reviewed  BASIC METABOLIC PANEL - Abnormal; Notable for the following:    Sodium 134 (*)    Glucose, Bld 305 (*)    All other components within normal limits  URINALYSIS, ROUTINE W REFLEX MICROSCOPIC - Abnormal; Notable for the following:    APPearance CLOUDY (*)    Glucose, UA >1000 (*)    Hgb urine dipstick TRACE (*)    Leukocytes, UA SMALL (*)    All other components within normal limits  URINE MICROSCOPIC-ADD ON - Abnormal; Notable for the following:    Squamous Epithelial / LPF FEW (*)    Bacteria, UA FEW (*)    All other  components within normal limits  URINE CULTURE  CBC WITH DIFFERENTIAL  PREGNANCY, URINE   No results found. No diagnosis found.    Date: 10/21/2012  Rate:57  Rhythm: Sinus bradycardia  QRS Axis: normal  Intervals: normal  ST/T Wave abnormalities: normal  Conduction Disutrbances: none  Narrative Interpretation: unremarkable    MDM  No gross neuro deficits. Patient feels better after by mouth meclizine and Ativan. Discharge meds include same. History and physical consistent with uncomplicated vertigo  Donnetta Hutching, MD 10/21/12 1400  Donnetta Hutching, MD 10/21/12 1401

## 2012-10-21 NOTE — ED Notes (Signed)
In addition to previous note, patient c/o blurred vision, headache, denies chest pain/numbness.

## 2012-10-21 NOTE — ED Notes (Signed)
Patient reports that she had flu-like symptoms 2 days ago and took OTC cold medicine. Patient c/o feeling dizzy and nauseated since yesterday.

## 2012-10-22 LAB — URINE CULTURE
Colony Count: NO GROWTH
Culture: NO GROWTH

## 2013-06-22 ENCOUNTER — Encounter (HOSPITAL_COMMUNITY): Payer: Self-pay | Admitting: Emergency Medicine

## 2013-06-22 ENCOUNTER — Emergency Department (INDEPENDENT_AMBULATORY_CARE_PROVIDER_SITE_OTHER)
Admission: EM | Admit: 2013-06-22 | Discharge: 2013-06-22 | Disposition: A | Discharge status: Home / Self Care | Payer: Self-pay | Source: Home / Self Care | Attending: Family Medicine | Admitting: Family Medicine

## 2013-06-22 ENCOUNTER — Other Ambulatory Visit (HOSPITAL_COMMUNITY)
Admission: RE | Admit: 2013-06-22 | Discharge: 2013-06-22 | Disposition: A | Discharge status: Home / Self Care | Payer: Self-pay | Source: Ambulatory Visit | Attending: Family Medicine | Admitting: Family Medicine

## 2013-06-22 DIAGNOSIS — Z113 Encounter for screening for infections with a predominantly sexual mode of transmission: Secondary | ICD-10-CM | POA: Insufficient documentation

## 2013-06-22 DIAGNOSIS — N760 Acute vaginitis: Secondary | ICD-10-CM

## 2013-06-22 DIAGNOSIS — Z202 Contact with and (suspected) exposure to infections with a predominantly sexual mode of transmission: Secondary | ICD-10-CM

## 2013-06-22 LAB — POCT URINALYSIS DIP (DEVICE)
Bilirubin Urine: NEGATIVE
Glucose, UA: 500 mg/dL — AB
KETONES UR: NEGATIVE mg/dL
Nitrite: POSITIVE — AB
PH: 6 (ref 5.0–8.0)
PROTEIN: NEGATIVE mg/dL
SPECIFIC GRAVITY, URINE: 1.02 (ref 1.005–1.030)
UROBILINOGEN UA: 0.2 mg/dL (ref 0.0–1.0)

## 2013-06-22 LAB — CERVICOVAGINAL ANCILLARY ONLY
WET PREP (BD AFFIRM): NEGATIVE
WET PREP (BD AFFIRM): POSITIVE — AB
Wet Prep (BD Affirm): POSITIVE — AB

## 2013-06-22 LAB — POCT PREGNANCY, URINE: PREG TEST UR: NEGATIVE

## 2013-06-22 MED ORDER — CEFTRIAXONE SODIUM 250 MG IJ SOLR
INTRAMUSCULAR | Status: AC
  Filled 2013-06-22: qty 250

## 2013-06-22 MED ORDER — METRONIDAZOLE 500 MG PO TABS: 500.0000 mg | ORAL_TABLET | Freq: Two times a day (BID) | ORAL | Status: DC

## 2013-06-22 MED ORDER — CEFTRIAXONE SODIUM 1 G IJ SOLR
250.0000 g | Freq: Once | INTRAMUSCULAR | Status: AC
  Administered 2013-06-22: 250 g via INTRAMUSCULAR

## 2013-06-22 MED ORDER — AZITHROMYCIN 250 MG PO TABS
1000.0000 mg | ORAL_TABLET | Freq: Once | ORAL | Status: AC
  Administered 2013-06-22: 1000 mg via ORAL

## 2013-06-22 MED ORDER — AZITHROMYCIN 250 MG PO TABS
ORAL_TABLET | ORAL | Status: AC
  Filled 2013-06-22: qty 4

## 2013-06-22 NOTE — ED Notes (Signed)
Pt   States   She    Was told  By  Her  Partner  That  He  Had  An std   She  denys  Any  Gyn  Symptoms        But  Does  Report  Some  Back pain

## 2013-06-22 NOTE — ED Provider Notes (Signed)
CSN: [id number]     Arrival date & time 06/22/13  1006 History   First MD Initiated Contact with Patient 06/22/13 1224     Chief Complaint  Patient presents with  . Exposure to STD   (Consider location/radiation/quality/duration/timing/severity/associated sxs/prior Treatment) Patient is a 38 y.o. female presenting with STD exposure. The history is provided by the patient.  Exposure to STD This is a new problem. The current episode started yesterday (told by partner that he had trich and chlamydia, pt here for exam, denies sx.). Pertinent negatives include no abdominal pain.    Past Medical History  Diagnosis Date  . Hypertension   . Diabetes mellitus without complication    Past Surgical History  Procedure Laterality Date  . Ankle surgery    . Cholecystectomy    . Hernia repair     No family history on file. History  Substance Use Topics  . Smoking status: Never Smoker   . Smokeless tobacco: Never Used  . Alcohol Use: No   OB History   Grav Para Term Preterm Abortions TAB SAB Ect Mult Living                 Review of Systems  Constitutional: Negative.   Gastrointestinal: Negative.  Negative for abdominal pain.  Genitourinary: Positive for vaginal discharge.    Allergies  Review of patient's allergies indicates no known allergies.  Home Medications   Prior to Admission medications   Medication Sig Start Date End Date Taking? Authorizing Provider  ibuprofen (ADVIL,MOTRIN) 200 MG tablet Take 600 mg by mouth every 6 (six) hours as needed for pain.    Historical Provider, MD  LORazepam (ATIVAN) 1 MG tablet Take 1 tablet (1 mg total) by mouth 3 (three) times daily as needed (Dizziness). 10/21/12   Nat Christen, MD  meclizine (ANTIVERT) 50 MG tablet Take 0.5 tablets (25 mg total) by mouth 3 (three) times daily as needed for dizziness. 10/21/12   Nat Christen, MD  metFORMIN (GLUCOPHAGE) 500 MG tablet Take 1 tablet (500 mg total) by mouth 2 (two) times daily with a meal. 07/09/12    Heather Laisure, PA-C   BP 165/107  Pulse 70  Temp(Src) 97.8 F (36.6 C) (Oral)  Resp 14  SpO2 100%  LMP 05/28/2013 Physical Exam  Constitutional: She is oriented to person, place, and time.  Abdominal: Soft. Bowel sounds are normal. There is no tenderness.  Genitourinary: Uterus normal. Cervix exhibits discharge. Cervix exhibits no motion tenderness and no friability. Right adnexum displays no tenderness. Left adnexum displays no tenderness. No erythema or tenderness around the vagina. Vaginal discharge found.  Neurological: She is alert and oriented to person, place, and time.  Skin: Skin is warm and dry.    ED Course  Procedures (including critical care time) Labs Review Labs Reviewed  POCT URINALYSIS DIP (DEVICE) - Abnormal; Notable for the following:    Glucose, UA 500 (*)    Hgb urine dipstick TRACE (*)    Nitrite POSITIVE (*)    Leukocytes, UA TRACE (*)    All other components within normal limits  POCT PREGNANCY, URINE  CERVICOVAGINAL ANCILLARY ONLY    Results for orders placed during the hospital encounter of 06/22/13  POCT URINALYSIS DIP (DEVICE)      Result Value Ref Range   Glucose, UA 500 (*) NEGATIVE mg/dL   Bilirubin Urine NEGATIVE  NEGATIVE   Ketones, ur NEGATIVE  NEGATIVE mg/dL   Specific Gravity, Urine 1.020  1.005 - 1.030  Hgb urine dipstick TRACE (*) NEGATIVE   pH 6.0  5.0 - 8.0   Protein, ur NEGATIVE  NEGATIVE mg/dL   Urobilinogen, UA 0.2  0.0 - 1.0 mg/dL   Nitrite POSITIVE (*) NEGATIVE   Leukocytes, UA TRACE (*) NEGATIVE  POCT PREGNANCY, URINE      Result Value Ref Range   Preg Test, Ur NEGATIVE  NEGATIVE   Imaging Review No results found.   MDM   1. Vaginitis        Billy Fischer, MD 06/22/13 1239

## 2013-06-23 LAB — CERVICOVAGINAL ANCILLARY ONLY
Chlamydia: NEGATIVE
Neisseria Gonorrhea: NEGATIVE

## 2013-06-25 ENCOUNTER — Telehealth (HOSPITAL_COMMUNITY): Payer: Self-pay | Admitting: *Deleted

## 2013-06-25 NOTE — ED Notes (Signed)
GC/Chlamydia neg., Affirm: Candida neg., Gardnerella and Trich pos.  Pt. adequately treated with Flagyl.  I called pt. Pt. verified x 2 and given results.  Pt. told she was adequately treated for both with the Flagyl.  Pt. instructed to notify her partner to be treated with Flagyl, no sex until you have finished your medication and your partner has been treated and to practice safe sex. You can get HIV testing at the [facility]D clinic, by appointment.  Pt. said her partner notified her and was already treated.  She said he had Chlamydia also. I told her again, that her Chlamydia was neg. Pt. had no further questions. Hanley Seamen [facility] 06/25/2013

## 2013-07-26 ENCOUNTER — Emergency Department (HOSPITAL_COMMUNITY)
Admission: EM | Admit: 2013-07-26 | Discharge: 2013-07-26 | Disposition: A | Discharge status: Home / Self Care | Payer: Self-pay | Attending: Emergency Medicine | Admitting: Emergency Medicine

## 2013-07-26 ENCOUNTER — Encounter (HOSPITAL_COMMUNITY): Payer: Self-pay | Admitting: Emergency Medicine

## 2013-07-26 ENCOUNTER — Emergency Department (HOSPITAL_COMMUNITY): Payer: Self-pay

## 2013-07-26 DIAGNOSIS — X503XXA Overexertion from repetitive movements, initial encounter: Secondary | ICD-10-CM | POA: Insufficient documentation

## 2013-07-26 DIAGNOSIS — Z79899 Other long term (current) drug therapy: Secondary | ICD-10-CM | POA: Insufficient documentation

## 2013-07-26 DIAGNOSIS — S39012A Strain of muscle, fascia and tendon of lower back, initial encounter: Secondary | ICD-10-CM

## 2013-07-26 DIAGNOSIS — Z792 Long term (current) use of antibiotics: Secondary | ICD-10-CM | POA: Insufficient documentation

## 2013-07-26 DIAGNOSIS — E119 Type 2 diabetes mellitus without complications: Secondary | ICD-10-CM | POA: Insufficient documentation

## 2013-07-26 DIAGNOSIS — I10 Essential (primary) hypertension: Secondary | ICD-10-CM | POA: Insufficient documentation

## 2013-07-26 DIAGNOSIS — S335XXA Sprain of ligaments of lumbar spine, initial encounter: Secondary | ICD-10-CM | POA: Insufficient documentation

## 2013-07-26 DIAGNOSIS — Y9389 Activity, other specified: Secondary | ICD-10-CM | POA: Insufficient documentation

## 2013-07-26 DIAGNOSIS — Z87440 Personal history of urinary (tract) infections: Secondary | ICD-10-CM | POA: Insufficient documentation

## 2013-07-26 DIAGNOSIS — Y9210 Unspecified residential institution as the place of occurrence of the external cause: Secondary | ICD-10-CM | POA: Insufficient documentation

## 2013-07-26 DIAGNOSIS — Z3202 Encounter for pregnancy test, result negative: Secondary | ICD-10-CM | POA: Insufficient documentation

## 2013-07-26 DIAGNOSIS — Y990 Civilian activity done for income or pay: Secondary | ICD-10-CM | POA: Insufficient documentation

## 2013-07-26 LAB — I-STAT CHEM 8, ED
BUN: 5 mg/dL — AB (ref 6–23)
CHLORIDE: 100 meq/L (ref 96–112)
CREATININE: 0.7 mg/dL (ref 0.50–1.10)
Calcium, Ion: 1.17 mmol/L (ref 1.12–1.23)
GLUCOSE: 241 mg/dL — AB (ref 70–99)
HCT: 38 % (ref 36.0–46.0)
Hemoglobin: 12.9 g/dL (ref 12.0–15.0)
POTASSIUM: 4.1 meq/L (ref 3.7–5.3)
SODIUM: 138 meq/L (ref 137–147)
TCO2: 26 mmol/L (ref 0–100)

## 2013-07-26 LAB — POC URINE PREG, ED: Preg Test, Ur: NEGATIVE

## 2013-07-26 LAB — URINALYSIS, ROUTINE W REFLEX MICROSCOPIC
Bilirubin Urine: NEGATIVE
Glucose, UA: >1000 mg/dL — AB
Hgb urine dipstick: NEGATIVE
Ketones, ur: NEGATIVE mg/dL
Leukocytes, UA: NEGATIVE
Nitrite: NEGATIVE
Protein, ur: NEGATIVE mg/dL
Specific Gravity, Urine: 1.023 (ref 1.005–1.030)
Urobilinogen, UA: 0.2 mg/dL (ref 0.0–1.0)
pH: 5.5 (ref 5.0–8.0)

## 2013-07-26 LAB — URINE MICROSCOPIC-ADD ON

## 2013-07-26 LAB — CBG MONITORING, ED: Glucose-Capillary: 208 mg/dL — ABNORMAL HIGH (ref 70–99)

## 2013-07-26 MED ORDER — METHOCARBAMOL 500 MG PO TABS: 500.0000 mg | ORAL_TABLET | Freq: Two times a day (BID) | ORAL | Status: DC

## 2013-07-26 MED ORDER — METFORMIN HCL 500 MG PO TABS: 500.0000 mg | ORAL_TABLET | Freq: Two times a day (BID) | ORAL | Status: DC

## 2013-07-26 NOTE — Progress Notes (Signed)
[facility] provided pt with a [facility] [employer] Card application to help patient establish primary care.  °

## 2013-07-26 NOTE — ED Provider Notes (Signed)
CSN: [id number]     Arrival date & time 07/26/13  1154 History  This chart was scribed for non-physician practitioner, Jamse Mead , working with Hoy Morn, MD, by Allena Earing ED Scribe. This patient was seen in [facility] and the patient's care was started at 12:47 PM.    Chief Complaint  Patient presents with  . Back Pain      The history is provided by the patient. No language interpreter was used.    HPI Comments: ALFRETTA PINCH is a 38 y.o. female who presents to the Emergency Department complaining of bilateral lower back pain that began around 1 week ago, the pain is aggravated by "deep breaths" and radiates into her ribs when she takes deep breaths. She reports that the pain is sometimes sharp and is usually aching . Pt reports that the pain began after she mowed the grass 07/17/13. She states that the pain is worse in the morning and describes it as "stiff and very painful". She denies any injury to her back, but reports that her work requires her to do a lot of lifting. 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 Pt denies CP, SOB, numbness, tingling, urinary and bowel incontinence, and no loss of sensation to her extremities. Pt reports that NSAIDS dull the pain a bit. She states that her heating pad provided little effect. Pt is not a smoker. Denied birth control.   Pt has h/o UTI.  Past Medical History  Diagnosis Date  . Hypertension   . Diabetes mellitus without complication    Past Surgical History  Procedure Laterality Date  . Ankle surgery    . Cholecystectomy    . Hernia repair     Family History  Problem Relation Age of Onset  . Hypertension Mother   . Diabetes Mother   . Hypertension Father   . Diabetes Father    History  Substance Use Topics  . Smoking status: Never Smoker   . Smokeless tobacco: Never Used  . Alcohol Use: No   OB History   Grav Para Term Preterm Abortions TAB SAB Ect  Mult Living                 Review of Systems  Constitutional: Positive for activity change. Negative for fever.  Respiratory: Negative for cough and shortness of breath.   Cardiovascular: Negative for chest pain.  Gastrointestinal: Negative for nausea, vomiting and diarrhea.  Genitourinary: Negative for frequency.  Musculoskeletal: Positive for back pain and myalgias. Negative for gait problem.  Neurological: Negative for weakness, numbness and headaches.  All other systems reviewed and are negative.     Allergies  No known allergies  Home Medications   Prior to Admission medications   Medication Sig Start Date End Date Taking? Authorizing Provider  ibuprofen (ADVIL,MOTRIN) 200 MG tablet Take 600 mg by mouth every 6 (six) hours as needed for pain.    Historical Provider, MD  LORazepam (ATIVAN) 1 MG tablet Take 1 tablet (1 mg total) by mouth 3 (three) times daily as needed (Dizziness). 10/21/12   Nat Christen, MD  meclizine (ANTIVERT) 50 MG tablet Take 0.5 tablets (25 mg total) by mouth 3 (three) times daily as needed for dizziness. 10/21/12   Nat Christen, MD  metFORMIN (GLUCOPHAGE) 500 MG tablet Take 1 tablet (500 mg total) by mouth 2 (two) times daily with a meal. 07/09/12   Hyman Bible, PA-C  metroNIDAZOLE (FLAGYL) 500 MG tablet Take 1 tablet (500 mg total) by mouth 2 (two) times daily. 06/22/13   Billy Fischer, MD   BP 149/83  Pulse 72  Temp(Src) 98 F (36.7 C) (Oral)  Resp 16  Ht 5\' 6"  (1.676 m)  Wt 210 lb (95.255 kg)  BMI 33.91 kg/m2  SpO2 100%  LMP 06/26/2013 Physical Exam  Nursing note and vitals reviewed. Constitutional: She is oriented to person, place, and time. She appears well-developed and well-nourished.  HENT:  Head: Normocephalic and atraumatic.  Mouth/Throat: Oropharynx is clear and moist. No oropharyngeal exudate.  Eyes: Conjunctivae and EOM are normal. Pupils are equal, round, and reactive to light. Right eye exhibits no discharge. Left eye exhibits no  discharge.  Neck: Normal range of motion. Neck supple. No tracheal deviation present.  Negative neck stiffness Negative nuchal rigidity Negative cervical lymphadenopathy Negative meningeal signs Negative pain upon palpation to the C-spine  Cardiovascular: Normal rate, regular rhythm and normal heart sounds.   No murmur heard. Pulses:      Radial pulses are 2+ on the right side, and 2+ on the left side.       Dorsalis pedis pulses are 2+ on the right side, and 2+ on the left side.  Pulmonary/Chest: Effort normal and breath sounds normal. No respiratory distress. She has no wheezes. She has no rales.  Abdominal: Soft. Bowel sounds are normal. She exhibits no distension.  Musculoskeletal: Normal range of motion.       Thoracic back: She exhibits tenderness and bony tenderness. She exhibits normal range of motion, no swelling, no edema, no deformity, no laceration, no pain and no spasm.       Back:  Negative swelling, erythema, inflammation, lesions, sores, deformities, bulging identified to the cervical/thoracic/lumbosacral spine. Discomfort upon palpation to the midthoracic and bilateral paravertebral regions of the thoracic spine. Full ROM to upper and lower extremities without difficulty noted, negative ataxia noted.  Lymphadenopathy:    She has no cervical adenopathy.  Neurological: She is alert and oriented to person, place, and time. No cranial nerve deficit. She exhibits normal muscle tone. Coordination normal.  Cranial nerves III-XII grossly intact Strength 5+/5+ to upper and lower extremities bilaterally with resistance applied, equal distribution noted Equal grip strength Sensation intact with differentiation sharp and dull touch Negative bilateral saddle paresthesias Negative arm drift Fine motor skills intact Heel to knee down shin normal bilaterally Gait proper, proper balance - negative sway, negative drift, negative step-offs  Skin: Skin is warm and dry. No rash noted. No  erythema.  Psychiatric: She has a normal mood and affect. Her behavior is normal. Thought content normal.    ED Course  Procedures (including critical care time)  DIAGNOSTIC STUDIES: Oxygen Saturation is 100% on RA, normal by my interpretation.    COORDINATION OF CARE:  12:57 PM-Discussed treatment plan which includes UA, XRAY with pt at bedside and pt agreed to plan.     Labs Review Labs Reviewed  URINALYSIS, ROUTINE W REFLEX MICROSCOPIC - Abnormal; Notable for the following:    Glucose, UA >1000 (*)    All other components within normal limits  URINE MICROSCOPIC-ADD ON - Abnormal; Notable for the following:    Squamous Epithelial / LPF FEW (*)    Bacteria, UA FEW (*)    All other components within normal limits  CBG MONITORING, ED - Abnormal; Notable for the following:    Glucose-Capillary 208 (*)    All other components within normal limits  I-STAT CHEM 8, ED - Abnormal; Notable for the following:    BUN 5 (*)    Glucose, Bld 241 (*)    All other components within normal limits  POC URINE PREG, ED    Imaging Review Dg Thoracic Spine 2 View  07/26/2013   CLINICAL DATA:  Mid to lower back pain.  No injury.  EXAM: THORACIC SPINE - 2 VIEW  COMPARISON:  Chest x-ray 02/18/2009  FINDINGS: Vertebral body alignment, heights and disc spaces are within normal. There is no compression fracture or subluxation. Pedicles are intact. There is mild degenerative change over the thoracolumbar junction. Is prominence of the main pulmonary artery segment unchanged.  IMPRESSION: No acute findings.  Minimal spondylosis.   Electronically Signed   By: Marin Olp M.D.   On: 07/26/2013 13:20   Dg Lumbar Spine Complete  07/26/2013   CLINICAL DATA:  Low back pain.  No injury.  EXAM: LUMBAR SPINE - COMPLETE 4+ VIEW  COMPARISON:  None.  FINDINGS: Vertebral body alignment, heights and disc space heights are within normal. There is no compression fracture or subluxation. There are surgical clips over  the right upper quadrant as well as surgical sutures over the midline abdomen. Remainder the exam is unremarkable.  IMPRESSION: Negative.   Electronically Signed   By: Marin Olp M.D.   On: 07/26/2013 13:21     EKG Interpretation None      MDM   Final diagnoses:  Lumbar strain    Filed Vitals:   07/26/13 1204  BP: 149/83  Pulse: 72  Temp: 98 F (36.7 C)  TempSrc: Oral  Resp: 16  Height: 5\' 6"  (1.676 m)  Weight: 210 lb (95.255 kg)  SpO2: 100%   I personally performed the services described in this documentation, which was scribed in my presence. The recorded information has been reviewed and is accurate.  Chem-8 noted mildly elevated glucose of 240-anion gap of 12.0 mEq per liter. Urine pregnancy negative. Urinalysis negative nitrites leukocytes identified. Negative hemoglobin noted. Findings ketones. Plain film of thoracic spine negative for acute osseous injury, mild spondylosis noted. Plain film of lumbar spine negative acute abnormalities noted. Doubt DKA. PERC score 0 - doubt PE. Doubt nephrolithiasis. Doubt pyelonephritis. Doubt cauda equina syndrome. Doubt epidural abscess. Suspicion to be muscular pain secondary to pain upon palpation of motion. Negative focal neurological deficits noted. Patient neurovascular intact. Strength equal distribution. Sensation intact. Gait proper with-negative red flags. Patient stable, afebrile. Negative signs of sepsis. Patient discharged. Discharged with muscle relaxers. Patient reported that she has not taken her metformin since the last time she was here secondary to monetary issues-refill patient's metformin. Discussed with patient to make muscle relaxer at night, work. Discussed with patient to rest and stay hydrated. Discussed with patient to apply warm compressions, icy hot ointment. Discussed with patient to avoid any physical or shortness activity. Referred to primary care provider and orthopedics. Discussed with patient to closely  monitor symptoms and if symptoms are to worsen or change to report back to the ED - strict return instructions given.  Patient agreed to plan of care, understood, all questions answered.   Jamse Mead, PA-C 07/26/13 1452

## 2013-07-26 NOTE — ED Provider Notes (Signed)
Medical screening examination/treatment/procedure(s) were performed by non-physician practitioner and as supervising physician I was immediately available for consultation/collaboration.   EKG Interpretation None        Hoy Morn, MD 07/26/13 1525

## 2013-07-26 NOTE — ED Notes (Signed)
Patient c/o bilateral lower back pain x 1 week. Patient denies any injury, but states she does a lot of lifting at her job. Patient denies any numbness or tingling of extremities.

## 2013-07-26 NOTE — Discharge Instructions (Signed)
Please call your doctor for a followup appointment within 24-48 hours. When you talk to your doctor please let them know that you were seen in the emergency department and have them acquire all of your records so that they can discuss the findings with you and formulate a treatment plan to fully care for your new and ongoing problems. Please call and set up an appointment with your primary care provider to be reassessed within the next 24-48 hours Please call and set up an appointment with orthopedics if the pain is to worsen Please take medications as prescribed-please do not take while at work for this can cause drowsiness Please rest and stay hydrated Please avoid any physical or strenuous activity Please apply icy hot ointment and massage-please perform stretching exercises to aid in muscular relief Please continue to monitor symptoms closely and if symptoms are to worsen or change (fever greater than 101, chills, chest pain, shortness of breath, difficulty breathing, numbness, tingling, worsening or changes to pain pattern, inability to control urine or bowel movements, weakness, inability to standup, inability to walk, fall, injury) please report back to the ED immediately    Back Pain, Adult Low back pain is very common. About 1 in 5 people have back pain.The cause of low back pain is rarely dangerous. The pain often gets better over time.About half of people with a sudden onset of back pain feel better in just 2 weeks. About 8 in 10 people feel better by 6 weeks.  CAUSES Some common causes of back pain include:  Strain of the muscles or ligaments supporting the spine.  Wear and tear (degeneration) of the spinal discs.  Arthritis.  Direct injury to the back. DIAGNOSIS Most of the time, the direct cause of low back pain is not known.However, back pain can be treated effectively even when the exact cause of the pain is unknown.Answering your caregiver's questions about your overall  health and symptoms is one of the most accurate ways to make sure the cause of your pain is not dangerous. If your caregiver needs more information, he or she may order lab work or imaging tests (X-rays or MRIs).However, even if imaging tests show changes in your back, this usually does not require surgery. HOME CARE INSTRUCTIONS For many people, back pain returns.Since low back pain is rarely dangerous, it is often a condition that people can learn to [facility] their own.   Remain active. It is stressful on the back to sit or stand in one place. Do not sit, drive, or stand in one place for more than 30 minutes at a time. Take short walks on level surfaces as soon as pain allows.Try to increase the length of time you walk each day.  Do not stay in bed.Resting more than 1 or 2 days can delay your recovery.  Do not avoid exercise or work.Your body is made to move.It is not dangerous to be active, even though your back may hurt.Your back will likely heal faster if you return to being active before your pain is gone.  Pay attention to your body when you bend and lift. Many people have less discomfortwhen lifting if they bend their knees, keep the load close to their bodies,and avoid twisting. Often, the most comfortable positions are those that put less stress on your recovering back.  Find a comfortable position to sleep. Use a firm mattress and lie on your side with your knees slightly bent. If you lie on your back, put a pillow  under your knees.  Only take over-the-counter or prescription medicines as directed by your caregiver. Over-the-counter medicines to reduce pain and inflammation are often the most helpful.Your caregiver may prescribe muscle relaxant drugs.These medicines help dull your pain so you can more quickly return to your normal activities and healthy exercise.  Put ice on the injured area.  Put ice in a plastic bag.  Place a towel between your skin and the bag.  Leave  the ice on for 15-20 minutes, 03-04 times a day for the first 2 to 3 days. After that, ice and heat may be alternated to reduce pain and spasms.  Ask your caregiver about trying back exercises and gentle massage. This may be of some benefit.  Avoid feeling anxious or stressed.Stress increases muscle tension and can worsen back pain.It is important to recognize when you are anxious or stressed and learn ways to manage it.Exercise is a great option. SEEK MEDICAL CARE IF:  You have pain that is not relieved with rest or medicine.  You have pain that does not improve in 1 week.  You have new symptoms.  You are generally not feeling well. SEEK IMMEDIATE MEDICAL CARE IF:   You have pain that radiates from your back into your legs.  You develop new bowel or bladder control problems.  You have unusual weakness or numbness in your arms or legs.  You develop nausea or vomiting.  You develop abdominal pain.  You feel faint. Document Released: 02/25/2005 Document Revised: 08/27/2011 Document Reviewed: 07/16/2010 [facility] Patient Information 2014 [place], [place].  Back Exercises Back exercises help treat and prevent back injuries. The goal is to increase your strength in your belly (abdominal) and back muscles. These exercises can also help with flexibility. Start these exercises when told by your doctor. HOME CARE Back exercises include: Pelvic Tilt.  Lie on your back with your knees bent. Tilt your pelvis until the lower part of your back is against the floor. Hold this position 5 to 10 sec. Repeat this exercise 5 to 10 times. Knee to Chest.  Pull 1 knee up against your chest and hold for 20 to 30 seconds. Repeat this with the other knee. This may be done with the other leg straight or bent, whichever feels better. Then, pull both knees up against your chest. Sit-Ups or Curl-Ups.  Bend your knees 90 degrees. Start with tilting your pelvis, and do a partial, slow sit-up. Only lift  your upper half 30 to 45 degrees off the floor. Take at least 2 to 3 seonds for each sit-up. Do not do sit-ups with your knees out straight. If partial sit-ups are difficult, simply do the above but with only tightening your belly (abdominal) muscles and holding it as told. Hip-Lift.  Lie on your back with your knees flexed 90 degrees. Push down with your feet and shoulders as you raise your hips 2 inches off the floor. Hold for 10 seconds, repeat 5 to 10 times. Back Arches.  Lie on your stomach. Prop yourself up on bent elbows. Slowly press on your hands, causing an arch in your low back. Repeat 3 to 5 times. Shoulder-Lifts.  Lie face down with arms beside your body. Keep hips and belly pressed to floor as you slowly lift your head and shoulders off the floor. Do not overdo your exercises. Be careful in the beginning. Exercises may cause you some mild back discomfort. If the pain lasts for more than 15 minutes, stop the exercises until you see  your doctor. Improvement with exercise for back problems is slow.  Document Released: 03/30/2010 Document Revised: 05/20/2011 Document Reviewed: 12/27/2010 [facility] Patient Information 2014 [place], [place].

## 2014-04-26 ENCOUNTER — Emergency Department (INDEPENDENT_AMBULATORY_CARE_PROVIDER_SITE_OTHER)
Admission: EM | Admit: 2014-04-26 | Discharge: 2014-04-26 | Disposition: A | Discharge status: Home / Self Care | Payer: Self-pay | Source: Home / Self Care | Attending: Family Medicine | Admitting: Family Medicine

## 2014-04-26 ENCOUNTER — Encounter (HOSPITAL_COMMUNITY): Payer: Self-pay | Admitting: Emergency Medicine

## 2014-04-26 DIAGNOSIS — E1165 Type 2 diabetes mellitus with hyperglycemia: Secondary | ICD-10-CM

## 2014-04-26 DIAGNOSIS — IMO0002 Reserved for concepts with insufficient information to code with codable children: Secondary | ICD-10-CM

## 2014-04-26 DIAGNOSIS — N390 Urinary tract infection, site not specified: Secondary | ICD-10-CM

## 2014-04-26 DIAGNOSIS — Z202 Contact with and (suspected) exposure to infections with a predominantly sexual mode of transmission: Secondary | ICD-10-CM

## 2014-04-26 DIAGNOSIS — K5900 Constipation, unspecified: Secondary | ICD-10-CM

## 2014-04-26 LAB — POCT URINALYSIS DIP (DEVICE)
Bilirubin Urine: NEGATIVE
GLUCOSE, UA: 500 mg/dL — AB
Hgb urine dipstick: NEGATIVE
KETONES UR: NEGATIVE mg/dL
LEUKOCYTES UA: NEGATIVE
Nitrite: POSITIVE — AB
PROTEIN: NEGATIVE mg/dL
Specific Gravity, Urine: 1.02 (ref 1.005–1.030)
Urobilinogen, UA: 0.2 mg/dL (ref 0.0–1.0)
pH: 5.5 (ref 5.0–8.0)

## 2014-04-26 LAB — POCT PREGNANCY, URINE: PREG TEST UR: NEGATIVE

## 2014-04-26 MED ORDER — CEPHALEXIN 500 MG PO CAPS: 500.0000 mg | ORAL_CAPSULE | Freq: Three times a day (TID) | ORAL | Status: DC

## 2014-04-26 MED ORDER — FLUCONAZOLE 150 MG PO TABS: 150.0000 mg | ORAL_TABLET | Freq: Every day | ORAL | Status: DC

## 2014-04-26 MED ORDER — METRONIDAZOLE 500 MG PO TABS: 2000.0000 mg | ORAL_TABLET | Freq: Once | ORAL | Status: DC

## 2014-04-26 NOTE — ED Provider Notes (Signed)
CSN: [id number]     Arrival date & time 04/26/14  1743 History   First MD Initiated Contact with Patient 04/26/14 1950     Chief Complaint  Patient presents with  . Exposure to STD  . Flank Pain   (Consider location/radiation/quality/duration/timing/severity/associated sxs/prior Treatment) HPI  R flank pain. Started 3 days ago. Denies fevers, chest pain, abdominal pain, diarrhea, nausea, emesis, shortness of breath.. BMs every 5 days. Intermittent. I think worsens or relieves the pain. Normal oral intake.  Pts sexual partner called her today and told him he has trich. Condoms intermittently. Denies vaginal discharge, irritation, lower abdominal pain.   DM: stopped metformin. Pt cut out sodas. Trying to control DM w/o medications. CBG avg high 100s.   Past Medical History  Diagnosis Date  . Hypertension   . Diabetes mellitus without complication    Past Surgical History  Procedure Laterality Date  . Ankle surgery    . Cholecystectomy    . Hernia repair     Family History  Problem Relation Age of Onset  . Hypertension Mother   . Diabetes Mother   . Hypertension Father   . Diabetes Father    History  Substance Use Topics  . Smoking status: Never Smoker   . Smokeless tobacco: Never Used  . Alcohol Use: No   OB History    No data available     Review of Systems Per HPI with all other pertinent systems negative.    Allergies  No known allergies  Home Medications   Prior to Admission medications   Medication Sig Start Date End Date Taking? Authorizing Provider  cephALEXin (KEFLEX) 500 MG capsule Take 1 capsule (500 mg total) by mouth 3 (three) times daily. 04/26/14   Waldemar Dickens, MD  fluconazole (DIFLUCAN) 150 MG tablet Take 1 tablet (150 mg total) by mouth daily. Repeat dose in 3 days 04/26/14   Waldemar Dickens, MD  metFORMIN (GLUCOPHAGE) 500 MG tablet Take 1 tablet (500 mg total) by mouth 2 (two) times daily with a meal. 07/26/13   Marissa Sciacca, PA-C   metroNIDAZOLE (FLAGYL) 500 MG tablet Take 4 tablets (2,000 mg total) by mouth once. 04/26/14   Waldemar Dickens, MD   Pulse 65  Temp(Src) 98 F (36.7 C) (Oral)  Resp 16  SpO2 100%  LMP 03/26/2014 Physical Exam  Constitutional: She is oriented to person, place, and time. She appears well-developed and well-nourished. No distress.  HENT:  Head: Normocephalic and atraumatic.  Eyes: EOM are normal. Pupils are equal, round, and reactive to light.  Neck: Normal range of motion.  Cardiovascular: Normal rate, normal heart sounds and intact distal pulses.   Pulmonary/Chest: Effort normal and breath sounds normal.  Abdominal: Soft. Bowel sounds are normal.  Musculoskeletal: Normal range of motion. She exhibits no edema or tenderness.  No CVA tenderness. No flank tenderness.  Neurological: She is alert and oriented to person, place, and time.  Skin: Skin is warm. She is not diaphoretic.  Psychiatric: She has a normal mood and affect. Her behavior is normal.    ED Course  Procedures (including critical care time) Labs Review Labs Reviewed  POCT URINALYSIS DIP (DEVICE) - Abnormal; Notable for the following:    Glucose, UA 500 (*)    Nitrite POSITIVE (*)    All other components within normal limits    Imaging Review No results found.   MDM   1. UTI (lower urinary tract infection)   2. STD exposure  3. Diabetes mellitus out of control   4. Constipation, unspecified constipation type    UTI based on UA, and symptoms. Start Keflex. Exposure to trichomonas. Metronidazole result 2 g 1 time dose. Diabetes: Patient counseled to restart metformin based on excessive amount of glucose in urine. Patient to monitor CBGs more closely at home. No current signs of metabolic derangement. Constipation: Patient to start daily to twice daily MiraLAX to improve bowel habits.   Precautions given and all questions answered  Linna Darner, MD Family Medicine 04/26/2014, 8:07 PM       Waldemar Dickens, MD 04/26/14 2007

## 2014-04-26 NOTE — Discharge Instructions (Signed)
You have a urinary tract infection. Please start the Keflex for this. The severe antibiotic until completed. We will treat you for a trichomonas infection. Please take the metronidazole as prescribed. Please restart her metformin until your sugar can get under better control. There is a large amount of sugar noticed in her urine today. We will call you if there is any other source of infection noted from the samples today.

## 2014-04-26 NOTE — ED Notes (Signed)
Reports having RLQ pain that radiates around to her back.  Symptoms present since Saturday.  Denies vaginal discharge.  Pt also reports that she was informed by partner earlier today that he tested positive for trick.

## 2014-04-28 LAB — URINE CULTURE: Colony Count: >=100000

## 2014-04-29 NOTE — ED Notes (Signed)
Urine culture: >100,000 colonies E. Coli.  Pt. adequately treated with Keflex. Roselyn Meier 04/29/2014

## 2015-01-04 IMAGING — CR DG THORACIC SPINE 2V
3 series · 3 of 3 positions shown · non-contrast
Comparison: Chest x-ray 02/18/2009

CLINICAL DATA: Mid to lower back pain.  No injury.

EXAM:
THORACIC SPINE - 2 VIEW

[t thoracic spine ap]
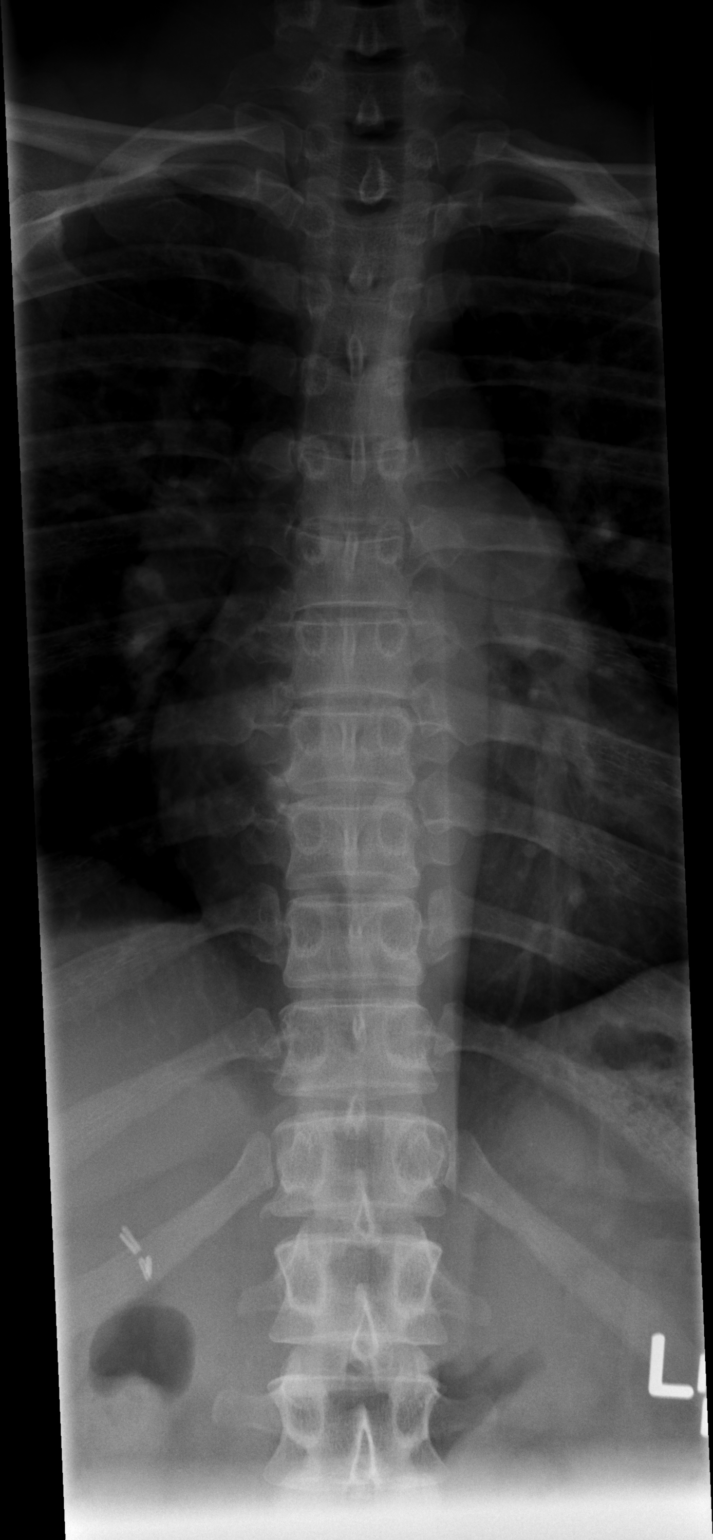

[t thoracic breathing lat]
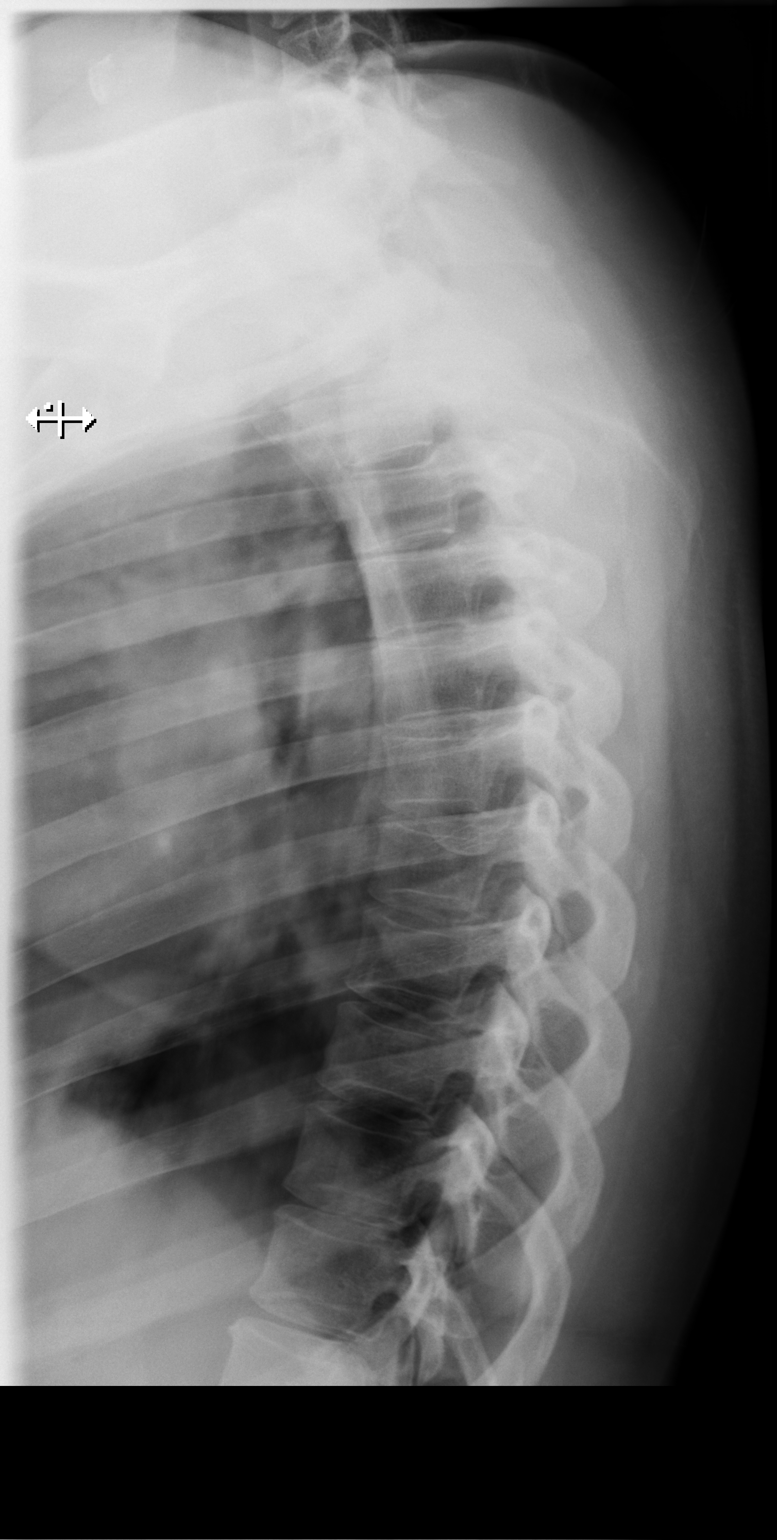

[t thoracic swimmers]
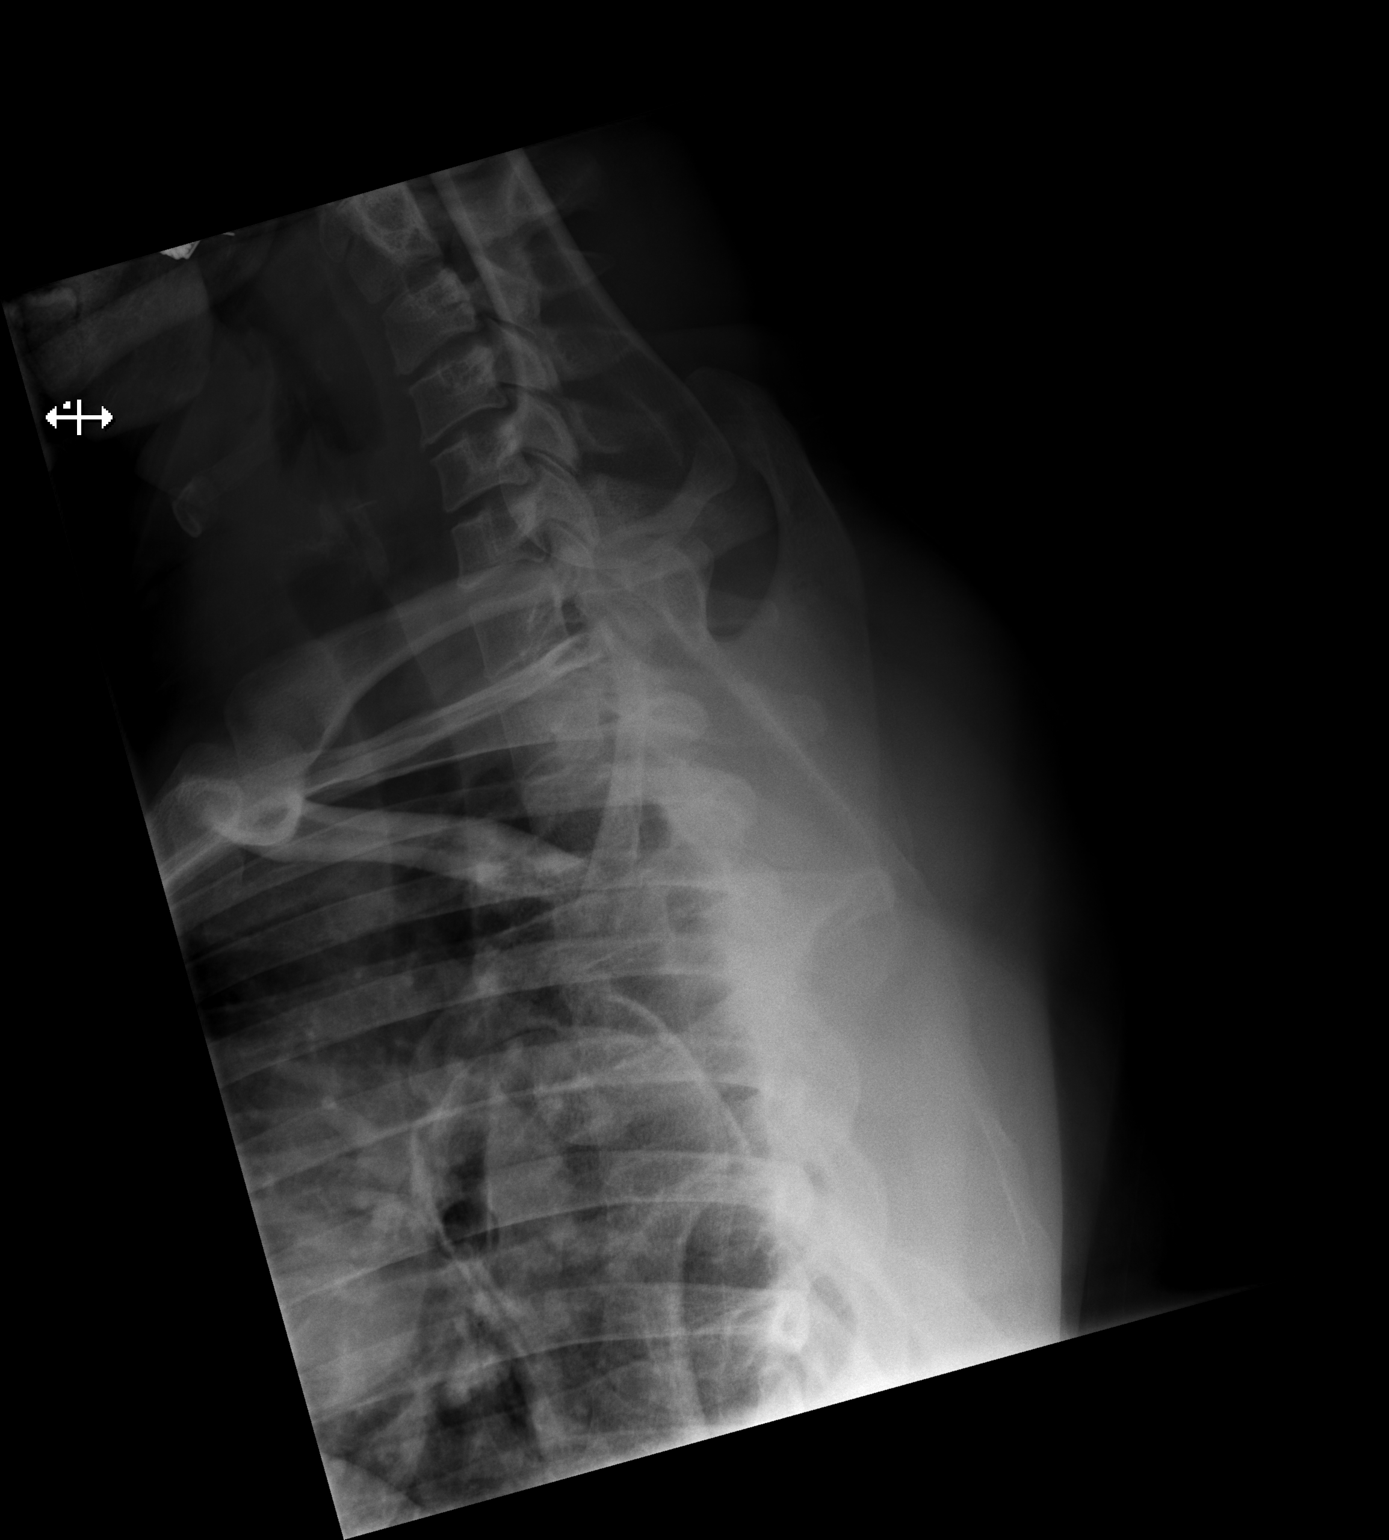

[3 of 3 positions shown; findings below may reference images not displayed]

FINDINGS: Vertebral body alignment, heights and disc spaces are within normal.
There is no compression fracture or subluxation. Pedicles are
intact. There is mild degenerative change over the thoracolumbar
junction. Is prominence of the main pulmonary artery segment
unchanged.
IMPRESSION: No acute findings.

Minimal spondylosis.

## 2018-06-03 ENCOUNTER — Other Ambulatory Visit: Payer: Self-pay

## 2018-06-03 ENCOUNTER — Encounter (HOSPITAL_COMMUNITY): Payer: Self-pay | Admitting: Emergency Medicine

## 2018-06-03 ENCOUNTER — Ambulatory Visit (HOSPITAL_COMMUNITY)
Admission: EM | Admit: 2018-06-03 | Discharge: 2018-06-03 | Disposition: A | Discharge status: Home / Self Care | Payer: Self-pay | Attending: Family Medicine | Admitting: Family Medicine

## 2018-06-03 DIAGNOSIS — I10 Essential (primary) hypertension: Secondary | ICD-10-CM

## 2018-06-03 DIAGNOSIS — M549 Dorsalgia, unspecified: Secondary | ICD-10-CM

## 2018-06-03 DIAGNOSIS — S161XXA Strain of muscle, fascia and tendon at neck level, initial encounter: Secondary | ICD-10-CM

## 2018-06-03 MED ORDER — PREDNISONE 50 MG PO TABS: 50.0000 mg | ORAL_TABLET | Freq: Every day | ORAL | 0 refills | Status: AC

## 2018-06-03 MED ORDER — AMLODIPINE BESYLATE 5 MG PO TABS: 5.0000 mg | ORAL_TABLET | Freq: Every day | ORAL | 0 refills | Status: DC

## 2018-06-03 MED ORDER — CYCLOBENZAPRINE HCL 5 MG PO TABS: 5.0000 mg | ORAL_TABLET | Freq: Two times a day (BID) | ORAL | 0 refills | Status: DC | PRN

## 2018-06-03 MED ORDER — CLONIDINE HCL 0.1 MG PO TABS
ORAL_TABLET | ORAL | Status: AC
  Filled 2018-06-03: qty 1

## 2018-06-03 MED ORDER — CLONIDINE HCL 0.1 MG PO TABS
0.1000 mg | ORAL_TABLET | Freq: Once | ORAL | Status: AC
Start: 2018-06-03 — End: 2018-06-03
  Administered 2018-06-03: 0.1 mg via ORAL

## 2018-06-03 MED ORDER — KETOROLAC TROMETHAMINE 30 MG/ML IJ SOLN
INTRAMUSCULAR | Status: AC
  Filled 2018-06-03: qty 1

## 2018-06-03 MED ORDER — IBUPROFEN 800 MG PO TABS: 800.0000 mg | ORAL_TABLET | Freq: Three times a day (TID) | ORAL | 0 refills | Status: DC

## 2018-06-03 MED ORDER — KETOROLAC TROMETHAMINE 30 MG/ML IJ SOLN
30.0000 mg | Freq: Once | INTRAMUSCULAR | Status: AC
  Administered 2018-06-03: 30 mg via INTRAMUSCULAR

## 2018-06-03 NOTE — ED Triage Notes (Signed)
Pt reports mid upper back pain that radiates into her right shoulder blade.  Pt thinks she may have injured it picking up a child at work a week ago.  She states it was getting better then one night in bed she turned and she felt a very sharp pain in the same area.

## 2018-06-03 NOTE — ED Provider Notes (Signed)
[place]    CSN: [id number] Arrival date & time: 06/03/18  1509     History   Chief Complaint Chief Complaint  Patient presents with  . Back Pain    HPI Jessica Herman is a 43 y.o. female history of hypertension, DM type II, presenting today for evaluation of neck and back pain.  Patient states that last Monday she believes she strained her neck and back while picking up her child.  She believes her symptoms were getting better, but over the weekend while she was in bed she turned and felt a strain and symptoms worsen since.  She has been taking Tylenol, ibuprofen 800 as well as Aleve and Epsom salts without relief.  Alternating ice and heat.  Pain worsens with moving neck as well as right arm.  Mild right upper chest discomfort, but denies chest pain or shortness of breath.  Denies headache or vision changes.  Patient currently does not have primary care and previously was on metformin and blood pressure medicine, she has not been on medicines for this in approximately 1 year.  Denies tearing sensation.  Denies lightheadedness or dizziness.  HPI  Past Medical History:  Diagnosis Date  . Diabetes mellitus without complication ([place])   . Hypertension     Patient Active Problem List   Diagnosis Date Noted  . ANEMIA 05/09/2008  . CONTACT DERMATITIS 04/28/2008  . HYPERGLYCEMIA 03/08/2008  . DEPRESSION/ANXIETY 02/23/2008  . HYPERTENSION 02/23/2008  . LOW BACK PAIN, MILD 02/23/2008  . URINALYSIS, ABNORMAL 02/23/2008  . 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 ANKLE SURGERY    . CHOLECYSTECTOMY    . HERNIA REPAIR      OB History   No obstetric history on file.      Home Medications    Prior to Admission medications   Medication Sig Start Date End Date Taking? Authorizing Provider  amLODipine (NORVASC) 5 MG tablet Take 1 tablet (5 mg total) by mouth daily. 06/03/18   Bertrand Vowels C, PA-C  cyclobenzaprine  (FLEXERIL) 5 MG tablet Take 1-2 tablets (5-10 mg total) by mouth 2 (two) times daily as needed for muscle spasms. 06/03/18   Tylik Treese C, PA-C  ibuprofen (ADVIL,MOTRIN) 800 MG tablet Take 1 tablet (800 mg total) by mouth 3 (three) times daily. 06/03/18   Talin Feister C, PA-C  metFORMIN (GLUCOPHAGE) 500 MG tablet Take 1 tablet (500 mg total) by mouth 2 (two) times daily with a meal. 07/26/13   Sciacca, Marissa, PA-C  predniSONE (DELTASONE) 50 MG tablet Take 1 tablet (50 mg total) by mouth daily for 5 days. 06/03/18 06/08/18  Korey Arroyo, Elesa Hacker, PA-C    Family History Family History  Problem Relation Age of Onset  . Hypertension Mother   . Diabetes Mother   . Hypertension Father   . Diabetes Father     Social History Social History   Tobacco Use  . Smoking status: Never Smoker  . Smokeless tobacco: Never Used  Substance Use Topics  . Alcohol use: No  . Drug use: No     Allergies   No known allergies   Review of Systems Review of Systems  Constitutional: Negative for fatigue and fever.  Eyes: Negative for photophobia, pain and visual disturbance.  Respiratory: Negative for cough and shortness of breath.   Cardiovascular: Negative for chest pain.  Gastrointestinal: Negative for abdominal pain, nausea and vomiting.  Genitourinary: Negative for decreased urine volume.  Musculoskeletal: Positive for back pain, myalgias and neck pain. Negative for neck stiffness.  Neurological: Negative for dizziness, syncope, facial asymmetry, speech difficulty, weakness, light-headedness, numbness and headaches.     Physical Exam Triage Vital Signs ED Triage Vitals  Enc Vitals Group     BP 06/03/18 1552 (!) 208/102     Pulse Rate 06/03/18 1552 83     Resp --      Temp 06/03/18 1552 99 F (37.2 C)     Temp Source 06/03/18 1552 Oral     SpO2 06/03/18 1552 100 %     Weight --      Height --      Head Circumference --      Peak Flow --      Pain Score 06/03/18 1550 9     Pain Loc  --      Pain Edu? --      Excl. in [place]? --    No data found.  Updated Vital Signs BP (!) 201/82 Comment: BP taken by Debara Pickett, PA  Pulse 83   Temp 99 F (37.2 C) (Oral)   LMP 05/03/2018 (Exact Date)   SpO2 100%  Blood pressure rechecked 16:10 220/92 Recheck prior to discharge 201/82 Visual Acuity Right Eye Distance:   Left Eye Distance:   Bilateral Distance:    Right Eye Near:   Left Eye Near:    Bilateral Near:     Physical Exam Vitals signs and nursing note reviewed.  Constitutional:      General: She is not in acute distress.    Appearance: She is well-developed.  HENT:     Head: Normocephalic and atraumatic.  Eyes:     Extraocular Movements: Extraocular movements intact.     Conjunctiva/sclera: Conjunctivae normal.     Pupils: Pupils are equal, round, and reactive to light.  Neck:     Musculoskeletal: Neck supple.  Cardiovascular:     Rate and Rhythm: Normal rate and regular rhythm.     Heart sounds: No murmur.  Pulmonary:     Effort: Pulmonary effort is normal. No respiratory distress.     Breath sounds: Normal breath sounds.     Comments: Breathing comfortably at rest, CTABL, no wheezing, rales or other adventitious sounds auscultated  Mild right-sided anterior chest tenderness Chest:     Chest wall: Tenderness present.  Abdominal:     Palpations: Abdomen is soft.     Tenderness: There is no abdominal tenderness.  Musculoskeletal:     Comments: Nontender to palpation of cervical, thoracic and lumbar spine midline, tenderness throughout right cervical musculature extending into trapezius and SCM, right upper thoracic musculature tender to palpation  Right shoulder: Full active range of motion, strength 5/5 and equal bilaterally Radial pulses 2+ bilaterally   Skin:    General: Skin is warm and dry.  Neurological:     Mental Status: She is alert.      UC Treatments / Results  Labs (all labs ordered are listed, but only abnormal results are  displayed) Labs Reviewed - No data to display  EKG None  Radiology No results found.  Procedures Procedures (including critical care time)  Medications Ordered in UC Medications  ketorolac (TORADOL) 30 MG/ML injection 30 mg (30 mg Intramuscular Given 06/03/18 1624)  cloNIDine (CATAPRES) tablet 0.1 mg (0.1 mg Oral Given 06/03/18 1623)    Initial Impression / Assessment and Plan / UC Course  I have reviewed the triage vital signs and the nursing notes.  Pertinent labs & imaging results that were available during my care of the patient were reviewed by me and considered in my medical decision making (see chart for details).  Clinical Course as of Jun 02 1741  Wed Jun 03, 2018  1718 201/82   [HW]    Clinical Course User Index [HW] Joneen Caraway, Dalal Livengood C, PA-C   Blood pressure significantly elevated in clinic, asymptomatic, back and neck pain seems more muscular versus related to underlying cardiac origin/aortic dissection.  Provided clonidine in clinic, monitored for 30 to 40 minutes, blood pressure improved, but still elevated.  Likely related to pain/chronic elevation from not being on medicine for the past year.  Will reinitiate on amlodipine 5 mg daily, establish care with PCP, contact provided.  Discussed warning signs and symptoms to monitor for to follow-up in emergency room for.  Neck and back pain reproduction with palpation, worsens with movement.  Most likely muscular.  We will continue to treat as such.  Toradol 30 mg provided in clinic.  Will initiate on prednisone as she is been using NSAIDs at home.  Flexeril as needed.  Discussed drowsiness regarding this.  Advised to refrain from NSAIDs at con commitment use of prednisone, may restart after completion of prednisone course.  Ice and heat.  Gentle stretching.  Discussed strict return precautions. Patient verbalized understanding and is agreeable with plan.  Final Clinical Impressions(s) / UC Diagnoses   Final diagnoses:   Strain of neck muscle, initial encounter     Discharge Instructions     Blood Pressure  We gave you a tablet of clonidine, begin amlodipine daily tomorrow Your blood pressure was elevated today in clinic. Please be sure to take blood pressure medications as prescribed. Please monitor your blood pressure at home or when you go to a [facility]/[facility]. Please follow up with your primary care doctor to recheck blood pressure and discuss any need for medication changes.   Please go to Emergency Room if you start to experience severe headache, vision changes, decreased urine production, chest pain, shortness of breath, speech slurring, one sided weakness.  Neck/back Pain We gave an injection of toradol today  Begin prednisone daily for 5 days with food After finishing prednisone- Use anti-inflammatories for pain/swelling. You may take up to 800 mg Ibuprofen every 8 hours with food. You may supplement Ibuprofen with Tylenol [phone] mg every 8 hours.   You may use flexeril as needed to help with pain. This is a muscle relaxer and causes sedation- please use only at bedtime or when you will be home and not have to drive/work- begin with 1 tablet, may increase to 2 if needed  Follow up if pain worsening, developing weakness, numbness tingling.      ED Prescriptions    Medication Sig Dispense Auth. Provider   cyclobenzaprine (FLEXERIL) 5 MG tablet Take 1-2 tablets (5-10 mg total) by mouth 2 (two) times daily as needed for muscle spasms. 24 tablet Nakisha Chai C, PA-C   predniSONE (DELTASONE) 50 MG tablet Take 1 tablet (50 mg total) by mouth daily for 5 days. 5 tablet Shishir Krantz C, PA-C   ibuprofen (ADVIL,MOTRIN) 800 MG tablet Take 1 tablet (800 mg total) by mouth 3 (three) times daily. 21 tablet Anatasia Tino C, PA-C   amLODipine (NORVASC) 5 MG tablet Take 1 tablet (5 mg total) by mouth daily. 60 tablet Asuna Peth, Castana C, PA-C     Controlled Substance Prescriptions [place] Controlled  Substance Registry consulted?  Not applicable   Deval Mroczka,  Symphoni Helbling C, PA-C 06/03/18 1743

## 2018-06-03 NOTE — Discharge Instructions (Addendum)
Blood Pressure  We gave you a tablet of clonidine, begin amlodipine daily tomorrow Your blood pressure was elevated today in clinic. Please be sure to take blood pressure medications as prescribed. Please monitor your blood pressure at home or when you go to a [facility]/[facility]. Please follow up with your primary care doctor to recheck blood pressure and discuss any need for medication changes.   Please go to Emergency Room if you start to experience severe headache, vision changes, decreased urine production, chest pain, shortness of breath, speech slurring, one sided weakness.  Neck/back Pain We gave an injection of toradol today  Begin prednisone daily for 5 days with food After finishing prednisone- Use anti-inflammatories for pain/swelling. You may take up to 800 mg Ibuprofen every 8 hours with food. You may supplement Ibuprofen with Tylenol [phone] mg every 8 hours.   You may use flexeril as needed to help with pain. This is a muscle relaxer and causes sedation- please use only at bedtime or when you will be home and not have to drive/work- begin with 1 tablet, may increase to 2 if needed  Follow up if pain worsening, developing weakness, numbness tingling.

## 2019-05-27 ENCOUNTER — Ambulatory Visit: Payer: Self-pay | Attending: Internal Medicine

## 2020-02-09 ENCOUNTER — Ambulatory Visit (HOSPITAL_COMMUNITY)
Admission: EM | Admit: 2020-02-09 | Discharge: 2020-02-09 | Disposition: A | Discharge status: Home / Self Care | Payer: Self-pay | Attending: Family Medicine | Admitting: Family Medicine

## 2020-02-09 ENCOUNTER — Other Ambulatory Visit: Payer: Self-pay

## 2020-02-09 ENCOUNTER — Encounter (HOSPITAL_COMMUNITY): Payer: Self-pay | Admitting: *Deleted

## 2020-02-09 DIAGNOSIS — I10 Essential (primary) hypertension: Secondary | ICD-10-CM

## 2020-02-09 MED ORDER — AMLODIPINE BESYLATE 5 MG PO TABS: 5.0000 mg | ORAL_TABLET | Freq: Every day | ORAL | 1 refills | Status: DC

## 2020-02-09 NOTE — ED Triage Notes (Signed)
Pt sent from dental office because BP  Was to high  When checked this morning. Pt in does not take BP meds now.

## 2020-02-09 NOTE — ED Provider Notes (Signed)
[place]    CSN: [id number] Arrival date & time: 02/09/20  1333      History   Chief Complaint Chief Complaint  Patient presents with  . Hypertension    HPI Jessica Herman is a 44 y.o. female.   Patient is a 44 year old female with past medical history of hypertension.  She presents today with elevated blood pressure reading.  Reporting she went to the dental office to have a tooth extracted earlier and blood pressure was 224/124.  Was told to have further evaluated.  She has not been on blood pressure medication in over a year or more.  She is currently denying any concerning symptoms to include headache, dizziness, blurred vision, chest pain, shortness of breath, numbness, tingling or extremity weakness.  Previously was taking amlodipine for blood pressure control     Past Medical History:  Diagnosis Date  . Diabetes mellitus without complication ([place])   . Hypertension     Patient Active Problem List   Diagnosis Date Noted  . ANEMIA 05/09/2008  . CONTACT DERMATITIS 04/28/2008  . HYPERGLYCEMIA 03/08/2008  . DEPRESSION/ANXIETY 02/23/2008  . HYPERTENSION 02/23/2008  . LOW BACK PAIN, MILD 02/23/2008  . URINALYSIS, ABNORMAL 02/23/2008  . 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 ANKLE SURGERY    . CHOLECYSTECTOMY    . HERNIA REPAIR      OB History   No obstetric history on file.      Home Medications    Prior to Admission medications   Medication Sig Start Date End Date Taking? Authorizing Provider  amLODipine (NORVASC) 5 MG tablet Take 1 tablet (5 mg total) by mouth daily. 02/09/20   Loura Halt A, NP  metFORMIN (GLUCOPHAGE) 500 MG tablet Take 1 tablet (500 mg total) by mouth 2 (two) times daily with a meal. 07/26/13 02/09/20  Sciacca, Mable Fill, PA-C    Family History Family History  Problem Relation Age of Onset  . Hypertension Mother   . Diabetes Mother   . Hypertension Father   . Diabetes  Father     Social History Social History   Tobacco Use  . Smoking status: Never Smoker  . Smokeless tobacco: Never Used  Substance Use Topics  . Alcohol use: No  . Drug use: No     Allergies   No known allergies   Review of Systems Review of Systems   Physical Exam Triage Vital Signs ED Triage Vitals  Enc Vitals Group     BP 02/09/20 1500 (!) 229/134     Pulse Rate 02/09/20 1500 78     Resp 02/09/20 1500 16     Temp 02/09/20 1500 98.1 F (36.7 C)     Temp Source 02/09/20 1500 Oral     SpO2 --      Weight 02/09/20 1504 175 lb (79.4 kg)     Height 02/09/20 1504 5\' 5"  (1.651 m)     Head Circumference --      Peak Flow --      Pain Score 02/09/20 1503 1     Pain Loc --      Pain Edu? --      Excl. in [place]? --    No data found.  Updated Vital Signs BP (!) 229/134 (BP Location: Left Arm)   Pulse 78   Temp 98.1 F (36.7 C) (Oral)   Resp 16   Ht 5\' 5"  (1.651 m)   Wt 175 lb (79.4  kg)   LMP 02/07/2020   BMI 29.12 kg/m   Visual Acuity Right Eye Distance:   Left Eye Distance:   Bilateral Distance:    Right Eye Near:   Left Eye Near:    Bilateral Near:     Physical Exam Vitals and nursing note reviewed.  Constitutional:      General: She is not in acute distress.    Appearance: Normal appearance. She is not ill-appearing, toxic-appearing or diaphoretic.  HENT:     Head: Normocephalic.  Eyes:     Conjunctiva/sclera: Conjunctivae normal.  Pulmonary:     Effort: Pulmonary effort is normal.  Musculoskeletal:        General: Normal range of motion.     Cervical back: Normal range of motion.  Skin:    General: Skin is warm and dry.     Findings: No rash.  Neurological:     Mental Status: She is alert.  Psychiatric:        Mood and Affect: Mood normal.      UC Treatments / Results  Labs (all labs ordered are listed, but only abnormal results are displayed) Labs Reviewed - No data to display  EKG   Radiology No results  found.  Procedures Procedures (including critical care time)  Medications Ordered in UC Medications - No data to display  Initial Impression / Assessment and Plan / UC Course  I have reviewed the triage vital signs and the nursing notes.  Pertinent labs & imaging results that were available during my care of the patient were reviewed by me and considered in my medical decision making (see chart for details).     Essential hypertension Patient blood pressure 229/134 today. She is not having any concerning signs, symptoms or red flags. We will restart her amlodipine as previously prescribed.  Recommended keep a log of blood pressures and low-sodium diet. Strict return and ER precautions given.  Patient plans to follow-up with primary care doctor for follow-up.  She is currently awaiting her insurance card in the mail Final Clinical Impressions(s) / UC Diagnoses   Final diagnoses:  Essential hypertension     Discharge Instructions     We will have you start back on the amlodipine for high blood pressure.  5 mg once daily.  Make sure you are monitoring your blood pressures daily to ensure that the blood pressure is decreasing. Keep a log.  If you are not seeing much change over the next week you can increase the amlodipine to 10 mg daily.  As we spoke about this could cause side effects to include lower extremity swelling.  Recommend follow-up with a primary care doctor for follow-up.  Between now and then for any other concerns please follow-up here, especially if your blood pressure is not lowering.  Recommend low-sodium diet and exercise at least 3 times a week.  Avoid stimulants including caffeine, smoking.  If you start developing any headache, dizziness, blurred vision, extremity weakness, numbness or tingling you will need to go straight to the ER.    ED Prescriptions    Medication Sig Dispense Auth. Provider   amLODipine (NORVASC) 5 MG tablet Take 1 tablet (5 mg total) by  mouth daily. 60 tablet Waldo Damian A, NP     PDMP not reviewed this encounter.   Orvan July, NP 02/09/20 1546

## 2020-02-09 NOTE — Discharge Instructions (Addendum)
We will have you start back on the amlodipine for high blood pressure.  5 mg once daily.  Make sure you are monitoring your blood pressures daily to ensure that the blood pressure is decreasing. Keep a log.  If you are not seeing much change over the next week you can increase the amlodipine to 10 mg daily.  As we spoke about this could cause side effects to include lower extremity swelling.  Recommend follow-up with a primary care doctor for follow-up.  Between now and then for any other concerns please follow-up here, especially if your blood pressure is not lowering.  Recommend low-sodium diet and exercise at least 3 times a week.  Avoid stimulants including caffeine, smoking.  If you start developing any headache, dizziness, blurred vision, extremity weakness, numbness or tingling you will need to go straight to the ER.

## 2020-08-14 NOTE — Patient Instructions (Signed)
It was wonderful to meet you today. Thank you for allowing me to be a part of your care. Below is a short summary of what we discussed at your visit today:  Establishing as a patient at the [facility] Today we reviewed all of your health history, including past medical conditions, medications, past surgeries, and allergies.  We also reviewed your vaccine status and necessary screenings, those are discussed below.  Labs Today we obtained blood work to check your A1c, a marker of your average blood sugar. If the results are normal, I will send you a letter or MyChart message. If the results are abnormal, I will give you a call.   Vaccines Today you received the Tdap vaccine.  You may experience some residual soreness at the injection site.  Gentle stretches and regular use of that arm will help speed up your recovery.  As the vaccines are giving your immune system a "practice run" against specific infections, you may feel a little under the weather for the next several days.  We recommend rest as needed and hydrating.  Screenings Hepatitis C and HIV: We obtained a blood sample to screen for Hep. C and HIV today. We recommend every adult is screened for HIV and hepatitis at least once in a lifetime.    PAP Smear: You are due for a PAP smear. Please see the next section of this paperwork for the day and time.   Mammograms: You will start mammograms at the age of 45 years old.   Colonoscopy: As you are 45 y.o. and without family history of colon cancer, you will need to start colon cancer screenings with colonoscopies at 45 years of age.    Please bring all of your medications to every appointment!  If you have any questions or concerns, please do not hesitate to contact us via phone or MyChart message.   Ezequiel Essex, MD

## 2020-08-15 ENCOUNTER — Encounter: Payer: Self-pay | Admitting: Family Medicine

## 2020-08-15 ENCOUNTER — Ambulatory Visit (INDEPENDENT_AMBULATORY_CARE_PROVIDER_SITE_OTHER): Payer: 59 | Admitting: Family Medicine

## 2020-08-15 ENCOUNTER — Other Ambulatory Visit: Payer: Self-pay

## 2020-08-15 VITALS — BP 180/110 | HR 90 | Ht 65.0 in | Wt 176.5 lb

## 2020-08-15 DIAGNOSIS — Z1159 Encounter for screening for other viral diseases: Secondary | ICD-10-CM

## 2020-08-15 DIAGNOSIS — R7309 Other abnormal glucose: Secondary | ICD-10-CM | POA: Diagnosis not present

## 2020-08-15 DIAGNOSIS — N924 Excessive bleeding in the premenopausal period: Secondary | ICD-10-CM

## 2020-08-15 DIAGNOSIS — E1159 Type 2 diabetes mellitus with other circulatory complications: Secondary | ICD-10-CM

## 2020-08-15 DIAGNOSIS — Z114 Encounter for screening for human immunodeficiency virus [HIV]: Secondary | ICD-10-CM | POA: Diagnosis not present

## 2020-08-15 DIAGNOSIS — M13872 Other specified arthritis, left ankle and foot: Secondary | ICD-10-CM | POA: Insufficient documentation

## 2020-08-15 DIAGNOSIS — Z0000 Encounter for general adult medical examination without abnormal findings: Secondary | ICD-10-CM | POA: Diagnosis not present

## 2020-08-15 DIAGNOSIS — O24419 Gestational diabetes mellitus in pregnancy, unspecified control: Secondary | ICD-10-CM

## 2020-08-15 DIAGNOSIS — Z23 Encounter for immunization: Secondary | ICD-10-CM | POA: Diagnosis not present

## 2020-08-15 DIAGNOSIS — N920 Excessive and frequent menstruation with regular cycle: Secondary | ICD-10-CM | POA: Insufficient documentation

## 2020-08-15 DIAGNOSIS — I152 Hypertension secondary to endocrine disorders: Secondary | ICD-10-CM

## 2020-08-15 DIAGNOSIS — I10 Essential (primary) hypertension: Secondary | ICD-10-CM

## 2020-08-15 HISTORY — DX: Gestational diabetes mellitus in pregnancy, unspecified control: O24.419

## 2020-08-15 MED ORDER — AMLODIPINE BESYLATE 5 MG PO TABS: 5.0000 mg | ORAL_TABLET | Freq: Every day | ORAL | 1 refills | Status: DC

## 2020-08-15 NOTE — Progress Notes (Signed)
TDAP

## 2020-08-15 NOTE — Assessment & Plan Note (Addendum)
-   Obtained blood work for A1c, hepatitis C, and HIV screenings today.  - Scheduled for PAP smear.  - Received TDaP today. Eligible for COVID booster and pneumococcal pneumonia 20 valent vaccine, declines at this time Provided with instructions on how to schedule RN-only vaccine visit.  - Not yet eligible for routine mammograms or colonoscopies.

## 2020-08-15 NOTE — Assessment & Plan Note (Signed)
Initial BP 180/110, repeat 160s/100s. Patient reports previously did well on amlodipine. Also has history of gestational diabetes in 2002 and being instructed to stay on metformin after delivery; however unsure if still diabetic. Will elect to restart amlodipine today as this worked for her previously, provided 30 day script. Will follow up in a 1-2 weeks. If A1c today indicates she is diabetic, will elect for ACE/ARB instead for renal protection.

## 2020-08-15 NOTE — Progress Notes (Addendum)
    SUBJECTIVE:   CHIEF COMPLAINT / HPI:   Establish care - presents today to establish at [facility] - has not been to PCP in several years - PMHx gestational diabetes, HTN, left ankle arthritis s/p crush injury, heavy menses - history including past medical, surgical, family, and meds updated in system - wants to focus on her BP today  Hypertension - previously on amlodipine, worked well for her for the first little while - has some dose adjustments through urgent care when going for other complaints - has run out - would like better control - no headaches, weakness, vision changes - no neurological complaints  PERTINENT  PMH / PSH: HTN, gestational diabetes  OBJECTIVE:   BP (!) 180/110   Pulse 90   Ht 5\' 5"  (1.651 m)   Wt 176 lb 8 oz (80.1 kg)   LMP 08/07/2020   SpO2 99%   BMI 29.37 kg/m    PHQ-9:  Depression screen [facility] 2/9 08/15/2020  Decreased Interest 0  Down, Depressed, Hopeless 0  PHQ - 2 Score 0  Altered sleeping 3  Tired, decreased energy 1  Change in appetite 0  Feeling bad or failure about yourself  0  Trouble concentrating 0  Moving slowly or fidgety/restless 0  Suicidal thoughts 0  PHQ-9 Score 4  Difficult doing work/chores Not difficult at all     GAD-7: No flowsheet data found.  Physical Exam General: Awake, alert, oriented HEENT: PERRL, bilateral TM pearly pink and flat, bilateral external auditory canals with minimal cerumen burden, no lesions, nasal mucosa slightly edematous, oral mucosa pink, moist, without lesion, intact dentition without obvious cavity Lymph: No palpable lymphedema of head or neck Cardiovascular: Regular rate and rhythm, S1 and S2 present, no murmurs auscultated Respiratory: Lung fields clear to auscultation bilaterally Abdomen: Soft, nondistended, no TTP in any quadrant, no rebound tenderness or guarding Extremities: No bilateral lower extremity edema, palpable pedal and pretibial pulses bilaterally Neuro: Cranial  nerves II through X grossly intact, able to move all extremities spontaneously   ASSESSMENT/PLAN:   Hypertension Initial BP 180/110, repeat 160s/100s. Patient reports previously did well on amlodipine. Also has history of gestational diabetes in 2002 and being instructed to stay on metformin after delivery; however unsure if still diabetic. Will elect to restart amlodipine today as this worked for her previously, provided 30 day script. Will follow up in a 1-2 weeks. If A1c today indicates she is diabetic, will elect for ACE/ARB instead for renal protection.   Healthcare maintenance - Obtained blood work for A1c, hepatitis C, and HIV screenings today.  - Scheduled for PAP smear.  - Received TDaP today. Eligible for COVID booster and pneumococcal pneumonia 20 valent vaccine, declines at this time Provided with instructions on how to schedule RN-only vaccine visit.  - Not yet eligible for routine mammograms or colonoscopies.      Ezequiel Essex, MD [place]

## 2020-08-16 ENCOUNTER — Encounter: Payer: Self-pay | Admitting: Family Medicine

## 2020-08-16 LAB — HCV AB W REFLEX TO QUANT PCR: HCV Ab: 0.3 {s_co_ratio} (ref 0.0–0.9)

## 2020-08-16 LAB — HIV ANTIBODY (ROUTINE TESTING W REFLEX): HIV Screen 4th Generation wRfx: NONREACTIVE

## 2020-08-16 LAB — HCV INTERPRETATION

## 2020-08-17 ENCOUNTER — Other Ambulatory Visit: Payer: 59

## 2020-09-06 ENCOUNTER — Ambulatory Visit (INDEPENDENT_AMBULATORY_CARE_PROVIDER_SITE_OTHER): Payer: 59 | Admitting: Family Medicine

## 2020-09-06 ENCOUNTER — Other Ambulatory Visit (HOSPITAL_COMMUNITY)
Admission: RE | Admit: 2020-09-06 | Discharge: 2020-09-06 | Disposition: A | Discharge status: Home / Self Care | Payer: 59 | Source: Ambulatory Visit | Attending: Family Medicine | Admitting: Family Medicine

## 2020-09-06 ENCOUNTER — Other Ambulatory Visit: Payer: Self-pay

## 2020-09-06 VITALS — BP 164/104 | HR 79 | Ht 65.0 in | Wt 182.0 lb

## 2020-09-06 DIAGNOSIS — I152 Hypertension secondary to endocrine disorders: Secondary | ICD-10-CM

## 2020-09-06 DIAGNOSIS — Z8632 Personal history of gestational diabetes: Secondary | ICD-10-CM

## 2020-09-06 DIAGNOSIS — Z124 Encounter for screening for malignant neoplasm of cervix: Secondary | ICD-10-CM | POA: Insufficient documentation

## 2020-09-06 DIAGNOSIS — Z113 Encounter for screening for infections with a predominantly sexual mode of transmission: Secondary | ICD-10-CM | POA: Diagnosis not present

## 2020-09-06 DIAGNOSIS — R7309 Other abnormal glucose: Secondary | ICD-10-CM

## 2020-09-06 DIAGNOSIS — I10 Essential (primary) hypertension: Secondary | ICD-10-CM

## 2020-09-06 DIAGNOSIS — E1159 Type 2 diabetes mellitus with other circulatory complications: Secondary | ICD-10-CM

## 2020-09-06 DIAGNOSIS — E119 Type 2 diabetes mellitus without complications: Secondary | ICD-10-CM

## 2020-09-06 HISTORY — DX: Personal history of gestational diabetes: Z86.32

## 2020-09-06 LAB — POCT GLYCOSYLATED HEMOGLOBIN (HGB A1C): HbA1c, POC (controlled diabetic range): 7.8 % — AB (ref 0.0–7.0)

## 2020-09-06 MED ORDER — AMLODIPINE BESYLATE 10 MG PO TABS: 10.0000 mg | ORAL_TABLET | Freq: Every day | ORAL | 3 refills | Status: DC

## 2020-09-06 NOTE — Assessment & Plan Note (Signed)
BP today 160s/100s while taking amlodipine 5 mg daily.  Improved from initial visit.  No neurological deficits, headache, vision changes.  Patient reports feeling better.  Increase amlodipine from 5 mg daily to 10 mg daily.  Instructed to return in a week for BP recheck and medication adjustment.  Given that her A1c resulted today at the end of the visit at 7.8, need to discuss ACE/ARB at next appointment for renal protection in diabetic patient.

## 2020-09-06 NOTE — Assessment & Plan Note (Signed)
Pap smear collected today.  Patient without complaints.  Denies abnormal vaginal discharge, pain, pruritus.  Is currently on first day of menses.  We will follow-up results when available.

## 2020-09-06 NOTE — Patient Instructions (Addendum)
RestartIt was wonderful to see you today. Thank you for allowing me to be a part of your care. Below is a short summary of what we discussed at your visit today:  Pap smear Today we performed a Pap smear.  We also collected swabs to test for STI. If the results are normal, I will send you a letter or MyChart message. If the results are abnormal, I will give you a call.    Blood pressure At the last appointment, and on amlodipine 5 mg daily.  Today your blood pressure is still high, so I will have you increase to amlodipine 10 mg daily.  I have sent in a new prescription for amlodipine 10 mg tablets.  While you are still using up your bottle of the smaller 5 mg tablets, you may take 2 5 mg tablets daily at the same time to equal 10 mg.  Come back in a week for recheck and further adjustment of the medicines.   If you have any questions or concerns, please do not hesitate to contact us via phone or MyChart message.   Ezequiel Essex, MD

## 2020-09-06 NOTE — Assessment & Plan Note (Addendum)
A1c resulted at 7.8.  As today's visit was geared towards Pap smear and patient is coming back in 1 week for BP recheck, will wait to discuss medication options until next week.  Talked about this with patient, she is amenable.  - Could consider SGLT2 instead of metformin for renal protective effects and weight loss.   - When discussing BP medication, consider ACE/ARB for this patient now that we have diagnosed diabetes.   - Have ordered urine microalbumin lab for completion at next week's appointment. - Referred to ophthalmology for diabetic eye exam

## 2020-09-06 NOTE — Progress Notes (Signed)
    SUBJECTIVE:   CHIEF COMPLAINT / HPI:   Pap smear - Routine - Per patient, no history of abnormal Pap smears - Also declines STI swab for GC, chlamydia  Blood pressure - Last appointment early June started on amlodipine 5 mg daily - BP today improved from last time still elevated 160s/100s -Try to get dental work performed, however was declined by dentist as their wrist BP cuff read systolics over 154 (question accuracy of wrist cuff) - States she feels better, no more headaches - No neurological focal deficits - No vision changes -Looking forward to getting her blood pressure under control so she can get her dental work performed and resolved dental pain  History gestational diabetes - After delivery, patient was instructed to continue taking metformin, however she no longer does and is unsure if she needs to - We will obtain A1c and urine microalbumin today  PERTINENT  PMH / PSH: Hypertension, gestational diabetes  OBJECTIVE:   BP (!) 164/104   Pulse 79   Ht 5\' 5"  (1.651 m)   Wt 182 lb (82.6 kg)   LMP 08/07/2020   SpO2 99%   BMI 30.29 kg/m   Physical Exam General: Awake, alert, oriented, no acute distress Respiratory: Normal work of breathing, no respiratory distress Neuro: Cranial nerves II through X grossly intact, able to move all extremities spontaneously Vulva: Normal appearing vulva without rashes, lesions, or deformities Vagina: Pale pink rugated vaginal tissue without obvious lesions, menstrual blood in vaginal vault, cervix without lesion or overt tenderness with swab   ASSESSMENT/PLAN:   Cervical cancer screening Pap smear collected today.  Patient without complaints.  Denies abnormal vaginal discharge, pain, pruritus.  Is currently on first day of menses.  We will follow-up results when available.  Hypertension BP today 160s/100s while taking amlodipine 5 mg daily.  Improved from initial visit.  No neurological deficits, headache, vision changes.   Patient reports feeling better.  Increase amlodipine from 5 mg daily to 10 mg daily.  Instructed to return in a week for BP recheck and medication adjustment.  Given that her A1c resulted today at the end of the visit at 7.8, need to discuss ACE/ARB at next appointment for renal protection in diabetic patient.  Diabetes mellitus without complication (HCC) M0Q resulted at 7.8.  As today's visit was geared towards Pap smear and patient is coming back in 1 week for BP recheck, will wait to discuss medication options until next week.  Talked about this with patient, she is amenable.  - Could consider SGLT2 instead of metformin for renal protective effects and weight loss.   - When discussing BP medication, consider ACE/ARB for this patient now that we have diagnosed diabetes.   - Have ordered urine microalbumin lab for completion at next week's appointment. - Referred to ophthalmology for diabetic eye exam     Ezequiel Essex, MD [place]

## 2020-09-13 LAB — CYTOLOGY - PAP
Chlamydia: NEGATIVE
Comment: NEGATIVE
Comment: NEGATIVE
Comment: NEGATIVE
Comment: NORMAL
Diagnosis: NEGATIVE
High risk HPV: NEGATIVE
Neisseria Gonorrhea: NEGATIVE
Trichomonas: NEGATIVE

## 2020-09-20 ENCOUNTER — Encounter: Payer: Self-pay | Admitting: Family Medicine

## 2020-09-20 ENCOUNTER — Ambulatory Visit (INDEPENDENT_AMBULATORY_CARE_PROVIDER_SITE_OTHER): Payer: 59 | Admitting: Family Medicine

## 2020-09-20 ENCOUNTER — Other Ambulatory Visit: Payer: Self-pay

## 2020-09-20 VITALS — BP 180/108 | HR 88 | Ht 65.0 in | Wt 180.0 lb

## 2020-09-20 DIAGNOSIS — I10 Essential (primary) hypertension: Secondary | ICD-10-CM | POA: Diagnosis not present

## 2020-09-20 DIAGNOSIS — R3915 Urgency of urination: Secondary | ICD-10-CM

## 2020-09-20 DIAGNOSIS — R350 Frequency of micturition: Secondary | ICD-10-CM

## 2020-09-20 DIAGNOSIS — E119 Type 2 diabetes mellitus without complications: Secondary | ICD-10-CM | POA: Diagnosis not present

## 2020-09-20 LAB — POCT URINALYSIS DIP (MANUAL ENTRY)
Bilirubin, UA: NEGATIVE
Glucose, UA: NEGATIVE mg/dL
Ketones, POC UA: NEGATIVE mg/dL
Nitrite, UA: NEGATIVE
Protein Ur, POC: NEGATIVE mg/dL
Spec Grav, UA: 1.02 (ref 1.010–1.025)
Urobilinogen, UA: 0.2 E.U./dL
pH, UA: 5.5 (ref 5.0–8.0)

## 2020-09-20 LAB — POCT UA - MICROSCOPIC ONLY: WBC, Ur, HPF, POC: >20 (ref 0–5)

## 2020-09-20 MED ORDER — LOSARTAN POTASSIUM 50 MG PO TABS: 50.0000 mg | ORAL_TABLET | Freq: Every day | ORAL | 3 refills | Status: DC

## 2020-09-20 MED ORDER — METFORMIN HCL ER 500 MG PO TB24: 500.0000 mg | ORAL_TABLET | Freq: Every day | ORAL | 0 refills | Status: DC

## 2020-09-20 NOTE — Progress Notes (Signed)
    SUBJECTIVE:   CHIEF COMPLAINT / HPI: follow up blood pressure  Presents for follow up for elevated blood pressure. Seen in clinic on 06/29 and Norvasc was increased to 10 mg.  Since then patient reports no improvement in blood pressure. BP at home still in 180's.   Associated symptoms include none.   Dysuria Urinary frequency for a few days. Denies any fevers, dysuria, hematuria,  abdominal pain or back pain.  Just completed menses couple of days ago.   PERTINENT  PMH / PSH:  HTN Diabetes Type 2  OBJECTIVE:   BP (!) 180/108   Pulse 88   Ht 5\' 5"  (1.651 m)   Wt 180 lb (81.6 kg)   LMP 09/06/2020 (Exact Date)   SpO2 100%   BMI 29.95 kg/m    General: Alert, no acute distress Cardio: Normal S1 and S2, RRR, no r/m/g Pulm: CTAB, normal work of breathing Abdomen: Bowel sounds normal. Abdomen soft and non-tender.  Extremities: No peripheral edema.    ASSESSMENT/PLAN:   Hypertension Continues to remain elevated despite increase in Amlodipine -Bmet today  -Continue Amlodipine 10 mg daily -Start Cozaar 50 mg daily -Monitor BP at home and record, will review at next visit -Repeat Bmet next week,future orders placed -Follow up in 2 weeks -If BP continues to remain elevated consider adding HCTZ  -Strict return precautions provided  Diabetes mellitus without complication (HCC) F5O 7.8. Metformin XR 500 mg daily, if able to tolerate  can titrate to BID next week Repeat A1c in 3 months Check Lipids  Follow up with PCP   Urinary frequency Urine positive for large amount of leukocytes. Discussed with patient and decided to wait for culture. -E.Coli > 100000 cfu resulted from urine culture -Keflex 500 mg QID x 5 days -Follow up if no improvement     Carollee Leitz, MD [place]

## 2020-09-20 NOTE — Patient Instructions (Addendum)
Thank you for coming to see me today. It was a pleasure.   Continue Amlodipine 10 mg daily Start Losartan 50 mg at night  Monitor your blood pressure at home two to three times a week  If your Blood pressure stays above 180, go to Emergency department If your Blood pressure is less than 70, go to the Emergency department  We will get some labs today.  If they are abnormal or we need to do something about them, I will call you.  If they are normal, I will send you a message on MyChart (if it is active) or a letter in the mail.  If you don't hear from [place] in 2 weeks, please call the office at the number below.    Start Metformin 500 mg daily. Take with breakfast  Please make an appointment to come in for some blood work on Monday or Tuesday of next week.  Follow up in 2 weeks for blood pressure check   If you have any questions or concerns, please do not hesitate to call the office at [phone].  Best,   Carollee Leitz, MD

## 2020-09-21 LAB — BASIC METABOLIC PANEL
BUN/Creatinine Ratio: 18 (ref 9–23)
BUN: 13 mg/dL (ref 6–24)
CO2: 21 mmol/L (ref 20–29)
Calcium: 9.1 mg/dL (ref 8.7–10.2)
Chloride: 100 mmol/L (ref 96–106)
Creatinine, Ser: 0.73 mg/dL (ref 0.57–1.00)
Glucose: 168 mg/dL — ABNORMAL HIGH (ref 65–99)
Potassium: 4.4 mmol/L (ref 3.5–5.2)
Sodium: 137 mmol/L (ref 134–144)
eGFR: 104 mL/min/{1.73_m2} (ref >59)

## 2020-09-21 LAB — MICROALBUMIN / CREATININE URINE RATIO
Creatinine, Urine: 65.2 mg/dL
Microalb/Creat Ratio: 96 mg/g creat — ABNORMAL HIGH (ref 0–29)
Microalbumin, Urine: 62.6 ug/mL

## 2020-09-22 MED ORDER — CEPHALEXIN 500 MG PO CAPS: 500.0000 mg | ORAL_CAPSULE | Freq: Four times a day (QID) | ORAL | 0 refills | Status: DC

## 2020-09-23 LAB — URINE CULTURE

## 2020-09-24 ENCOUNTER — Encounter: Payer: Self-pay | Admitting: Family Medicine

## 2020-09-24 DIAGNOSIS — R350 Frequency of micturition: Secondary | ICD-10-CM | POA: Insufficient documentation

## 2020-09-24 NOTE — Assessment & Plan Note (Signed)
A1c 7.8. Metformin XR 500 mg daily, if able to tolerate  can titrate to BID next week Repeat A1c in 3 months Check Lipids  Follow up with PCP

## 2020-09-24 NOTE — Assessment & Plan Note (Signed)
Urine positive for large amount of leukocytes. Discussed with patient and decided to wait for culture. -E.Coli > 100000 cfu resulted from urine culture -Keflex 500 mg QID x 5 days -Follow up if no improvement

## 2020-09-24 NOTE — Assessment & Plan Note (Signed)
Continues to remain elevated despite increase in Amlodipine -Bmet today  -Continue Amlodipine 10 mg daily -Start Cozaar 50 mg daily -Monitor BP at home and record, will review at next visit -Repeat Bmet next week,future orders placed -Follow up in 2 weeks -If BP continues to remain elevated consider adding HCTZ  -Strict return precautions provided

## 2020-09-25 ENCOUNTER — Other Ambulatory Visit: Payer: 59

## 2020-09-25 ENCOUNTER — Other Ambulatory Visit: Payer: Self-pay

## 2020-09-25 DIAGNOSIS — E119 Type 2 diabetes mellitus without complications: Secondary | ICD-10-CM

## 2020-09-26 LAB — SPECIMEN STATUS

## 2020-09-28 LAB — LIPID PANEL
Chol/HDL Ratio: 3.2 ratio (ref 0.0–4.4)
Cholesterol, Total: 139 mg/dL (ref 100–199)
HDL: 44 mg/dL (ref >39)
LDL Chol Calc (NIH): 75 mg/dL (ref 0–99)
Triglycerides: 112 mg/dL (ref 0–149)
VLDL Cholesterol Cal: 20 mg/dL (ref 5–40)

## 2020-09-28 LAB — LDL CHOLESTEROL, DIRECT: LDL Direct: 76 mg/dL (ref 0–99)

## 2020-09-28 LAB — BASIC METABOLIC PANEL
BUN/Creatinine Ratio: 17 (ref 9–23)
BUN: 13 mg/dL (ref 6–24)
CO2: 23 mmol/L (ref 20–29)
Calcium: 9.5 mg/dL (ref 8.7–10.2)
Chloride: 101 mmol/L (ref 96–106)
Creatinine, Ser: 0.75 mg/dL (ref 0.57–1.00)
Glucose: 182 mg/dL — ABNORMAL HIGH (ref 65–99)
Potassium: 4.1 mmol/L (ref 3.5–5.2)
Sodium: 137 mmol/L (ref 134–144)
eGFR: 101 mL/min/{1.73_m2} (ref >59)

## 2020-09-28 LAB — SPECIMEN STATUS REPORT

## 2020-09-29 NOTE — Progress Notes (Signed)
Hi Dr Jeani Hawking,  I didn't order a CBC, this was likely error.  Thanks

## 2020-10-03 NOTE — Progress Notes (Signed)
    SUBJECTIVE:   CHIEF COMPLAINT / HPI:   Presents for follow up for elevated blood pressure and diabetes. Seen in clinic on 07/13 and continued Amlodipine 10 mg as well as initiated Cozaar 50 mg daily. Also initiated Metformin XR 500 mg daily for A1c 7.8.  Since then patient reports improvement in home SBP 120's-140's on Cozaar for 8 days and tolerating Metformin.  Associated symptoms include none.   PERTINENT  PMH / PSH: HTN DM Type 2 HLD OBJECTIVE:   BP (!) 154/82   Pulse 83   Ht '5\' 5"'$  (1.651 m)   Wt 185 lb 3.2 oz (84 kg)   LMP 09/06/2020 (Exact Date)   SpO2 99%   BMI 30.82 kg/m    General: Alert, no acute distress Cardio: Normal S1 and S2, RRR, no r/m/g Pulm: CTAB, normal work of breathing Extremities: No peripheral edema.   ASSESSMENT/PLAN:   Hypertension Not at goal today in clinic.  Home SBP 120's -140's.  Has been on Cozaar for 8 days.  Dental procedure tomorrow, likely some anxiety elevating BP.  -Will continue current medication -Follow up in 2-3 weeks, if continues to remain elevated will start HCTZ  '25mg'$  daily.  -Repeat BMet today -Continue to monitor BP at home. If SBP remains > 120, patient aware to call to reevaluate BP meds sooner.  Diabetes mellitus without complication (HCC) Increase Metformin XR to 500 mg BID LDL 115,goal <70 Discussed initiating statin, patient declined now and prefers to diet and exercise.  Would continue statin discussion   10 yr Cardiac risk- [place]-13.7%                              - ASCVD-2.6%                               - Revised PCE- 2.7%     Carollee Leitz, MD [place]

## 2020-10-03 NOTE — Patient Instructions (Signed)
Thank you for coming to see me today. It was a pleasure.    Continue your blood pressure medication at the current dosing.   Bring in your blood pressure monitor at your next visit  Increase your Metformin to 500 mg twice a day  You are due for an eye exam.  Please make sure that you call your eye doctor to have this scheduled and have them fax the results to our office.   Please follow-up with PCP in 2-4 weeks  If you have any questions or concerns, please do not hesitate to call the office at [phone].  Best,   Carollee Leitz, MD

## 2020-10-04 ENCOUNTER — Ambulatory Visit (INDEPENDENT_AMBULATORY_CARE_PROVIDER_SITE_OTHER): Payer: 59 | Admitting: Family Medicine

## 2020-10-04 ENCOUNTER — Other Ambulatory Visit: Payer: Self-pay

## 2020-10-04 VITALS — BP 154/82 | HR 83 | Ht 65.0 in | Wt 185.2 lb

## 2020-10-04 DIAGNOSIS — E119 Type 2 diabetes mellitus without complications: Secondary | ICD-10-CM | POA: Diagnosis not present

## 2020-10-04 DIAGNOSIS — I10 Essential (primary) hypertension: Secondary | ICD-10-CM

## 2020-10-04 MED ORDER — METFORMIN HCL ER 500 MG PO TB24: 500.0000 mg | ORAL_TABLET | Freq: Two times a day (BID) | ORAL | 0 refills | Status: DC

## 2020-10-05 ENCOUNTER — Encounter: Payer: Self-pay | Admitting: Family Medicine

## 2020-10-05 DIAGNOSIS — F410 Panic disorder [episodic paroxysmal anxiety] without agoraphobia: Secondary | ICD-10-CM

## 2020-10-05 LAB — BASIC METABOLIC PANEL
BUN/Creatinine Ratio: 11 (ref 9–23)
BUN: 8 mg/dL (ref 6–24)
CO2: 21 mmol/L (ref 20–29)
Calcium: 9.2 mg/dL (ref 8.7–10.2)
Chloride: 101 mmol/L (ref 96–106)
Creatinine, Ser: 0.76 mg/dL (ref 0.57–1.00)
Glucose: 195 mg/dL — ABNORMAL HIGH (ref 65–99)
Potassium: 4.3 mmol/L (ref 3.5–5.2)
Sodium: 138 mmol/L (ref 134–144)
eGFR: 99 mL/min/{1.73_m2} (ref >59)

## 2020-10-06 MED ORDER — HYDROXYZINE HCL 10 MG PO TABS: 10.0000 mg | ORAL_TABLET | Freq: Four times a day (QID) | ORAL | 0 refills | Status: DC | PRN

## 2020-10-06 NOTE — Telephone Encounter (Signed)
Called patient to check in. Patient reports that 30 minutes prior to dental appointment, BP was 147/79. Patient reports having increased levels of anxiety with dental procedures.   Patient had to be rescheduled on 8/5 for procedure.   BP today: 137/65. Patient is asymptomatic.   Please advise.   Talbot Grumbling, RN

## 2020-10-06 NOTE — Telephone Encounter (Signed)
Prescribed small script of vistaril for anxiety prior to dental visit.   Ezequiel Essex, MD

## 2020-10-08 ENCOUNTER — Encounter: Payer: Self-pay | Admitting: Family Medicine

## 2020-10-08 NOTE — Assessment & Plan Note (Addendum)
Not at goal today in clinic.  Home SBP 120's -140's.  Has been on Cozaar for 8 days.  Dental procedure tomorrow, likely some anxiety elevating BP.  -Will continue current medication -Follow up in 2-3 weeks, if continues to remain elevated will start HCTZ  '25mg'$  daily.  -Repeat BMet today -Continue to monitor BP at home. If SBP remains > 120, patient aware to call to reevaluate BP meds sooner.

## 2020-10-08 NOTE — Assessment & Plan Note (Signed)
Increase Metformin XR to 500 mg BID LDL 115,goal <70 Discussed initiating statin, patient declined now and prefers to diet and exercise.  Would continue statin discussion   10 yr Cardiac risk- [place]-13.7%                              - ASCVD-2.6%                               - Revised PCE- 2.7%

## 2020-11-07 ENCOUNTER — Ambulatory Visit: Payer: 59 | Admitting: Family Medicine

## 2020-11-17 ENCOUNTER — Ambulatory Visit (INDEPENDENT_AMBULATORY_CARE_PROVIDER_SITE_OTHER): Payer: 59 | Admitting: Family Medicine

## 2020-11-17 ENCOUNTER — Other Ambulatory Visit: Payer: Self-pay

## 2020-11-17 ENCOUNTER — Encounter: Payer: Self-pay | Admitting: Family Medicine

## 2020-11-17 VITALS — BP 171/93 | HR 78 | Ht 65.0 in | Wt 179.6 lb

## 2020-11-17 DIAGNOSIS — I10 Essential (primary) hypertension: Secondary | ICD-10-CM | POA: Diagnosis not present

## 2020-11-17 DIAGNOSIS — E119 Type 2 diabetes mellitus without complications: Secondary | ICD-10-CM | POA: Diagnosis not present

## 2020-11-17 NOTE — Patient Instructions (Signed)
It was wonderful to see you today. Thank you for allowing me to be a part of your care. Below is a short summary of what we discussed at your visit today:  Diabetes -You are due for another A1c check in about a month or so - Try to take the metformin 1000 mg (2 tabs) daily  Blood pressure -Your home blood pressure measurements look fantastic - The blood pressure measurement in the office is likely elevated because of movement and stress from the day - Because your home blood pressure measurements are so good consistently, I do not think we need to make any changes to your medicines right now  Sinuses and allergies - Use Flonase in both nostrils every night at bedtime.  Remember to aim towards the outside of your nostril to coat the mucosa.  Do NOT aim towards your brain. - Take an allergy pill every day for at least a week at bedtime.  You may choose any over-the-counter allergy pill that you like.  The brands called Allegra, Claritin, and Xyzal are supposed to be the least drowsy. - For 2 to 3 days, take a decongestant like Sudafed (pseudoephedrine).  This will help to dry out your sinuses and get rid of the fluid behind your ear drum.   **!** Keep in mind, your blood pressure will go up while you are taking the Sudafed.  Your blood pressure should return to normal after you stop taking it.   Please bring all of your medications to every appointment!  If you have any questions or concerns, please do not hesitate to contact us via phone or MyChart message.   Ezequiel Essex, MD

## 2020-11-17 NOTE — Progress Notes (Signed)
    SUBJECTIVE:   CHIEF COMPLAINT / HPI:   Hypertension - Current regimen includes losartan 50 mg tablets nightly, amlodipine 10 mg nightly -Last appointment on 7/27 BP was 134/82, no changes made at that time - BP today 176/96, consistently high despite measuring on different arm and manually - patient reports being quite stressed trying to get here on time, feels quite anxious - home BP cuff measurements much better  - measures BP at home 2-3 times per day - home BP readings range systolics [id number] and diastolics [id number] in the last two weeks - no complaints today, no headache, focal neuro deficits, vision changes  Diabetes - Last A1c 7.8 on 09/06/2020 - Current regimen includes metformin XR 500 mg twice daily - she reports that honestly sometimes she forgets to take the metformin twice daily because her other medications are only once daily - has a lot going on with her kids and work, so she sometimes misses the evening dose  - just started metformin at end of July - no diarrhea, abdominal pain, nausea   PERTINENT  PMH / PSH: HTN, T2DM, urinary frequency  OBJECTIVE:   BP (!) 171/93   Pulse 78   Ht '5\' 5"'$  (1.651 m)   Wt 179 lb 9.6 oz (81.5 kg)   LMP 10/12/2020   SpO2 100%   BMI 29.89 kg/m    PHQ-9:  Depression screen [facility] 2/9 11/17/2020 09/20/2020 09/06/2020  Decreased Interest 0 0 0  Down, Depressed, Hopeless 0 0 0  PHQ - 2 Score 0 0 0  Altered sleeping 0 1 3  Tired, decreased energy '1 1 1  '$ Change in appetite 0 0 0  Feeling bad or failure about yourself  0 0 0  Trouble concentrating 0 0 0  Moving slowly or fidgety/restless 0 0 0  Suicidal thoughts 0 0 0  PHQ-9 Score '1 2 4  '$ Difficult doing work/chores Not difficult at all Not difficult at all Not difficult at all     GAD-7: No flowsheet data found.   Physical Exam General: Awake, alert, oriented Cardiovascular: Regular rate and rhythm, S1 and S2 present, no murmurs auscultated Respiratory: Lung fields clear to  auscultation bilaterally Neuro: Cranial nerves II through X grossly intact, able to move all extremities spontaneously  ASSESSMENT/PLAN:   Hypertension No changes today despite high in-office measurements because these are likely due to rushing in and anxiety. Home BP measurements range 120-130s/60-80s consistently. No BP meds missed. No focal neuro deficits or red flag HTN sxs. Will continue regimen of losartan 50 mg and amlodipine 10 mg. No changes at this time. Follow up in 1-2 weeks. If still elevated at that time, may consider adding HCTZ or increasing losartan to 100 mg.   Diabetes mellitus without complication ([place]) Last [id number] 7.8 at end of June. Just started metformin at end of July. Not yet due for A1c. Patient endorses forgetting metformin BID and only taking 500 mg once daily along with her other medicines. Denies GI upset from metformin XR formulation. Discussed simply taking 1,000 mg metformin XR in the morning instead of trying to divide it BID. Assured her that if the metformin doesn't work well for her, we could also discuss other medication options such as Jardiance or Trulicity.    Ezequiel Essex, MD [place]

## 2020-11-19 NOTE — Assessment & Plan Note (Signed)
Last A1c 7.8 at end of June. Just started metformin at end of July. Not yet due for A1c. Patient endorses forgetting metformin BID and only taking 500 mg once daily along with her other medicines. Denies GI upset from metformin XR formulation. Discussed simply taking 1,000 mg metformin XR in the morning instead of trying to divide it BID. Assured her that if the metformin doesn't work well for her, we could also discuss other medication options such as Jardiance or Trulicity.

## 2020-11-19 NOTE — Assessment & Plan Note (Signed)
No changes today despite high in-office measurements because these are likely due to rushing in and anxiety. Home BP measurements range 120-130s/60-80s consistently. No BP meds missed. No focal neuro deficits or red flag HTN sxs. Will continue regimen of losartan 50 mg and amlodipine 10 mg. No changes at this time. Follow up in 1-2 weeks. If still elevated at that time, may consider adding HCTZ or increasing losartan to 100 mg.

## 2020-11-29 ENCOUNTER — Other Ambulatory Visit: Payer: Self-pay

## 2020-11-29 ENCOUNTER — Ambulatory Visit (INDEPENDENT_AMBULATORY_CARE_PROVIDER_SITE_OTHER): Payer: 59 | Admitting: Family Medicine

## 2020-11-29 ENCOUNTER — Other Ambulatory Visit: Payer: Self-pay | Admitting: Family Medicine

## 2020-11-29 VITALS — BP 176/91 | HR 84 | Ht 65.0 in | Wt 182.6 lb

## 2020-11-29 DIAGNOSIS — I10 Essential (primary) hypertension: Secondary | ICD-10-CM | POA: Diagnosis not present

## 2020-11-29 DIAGNOSIS — J019 Acute sinusitis, unspecified: Secondary | ICD-10-CM

## 2020-11-29 MED ORDER — LOSARTAN POTASSIUM 100 MG PO TABS: 100.0000 mg | ORAL_TABLET | Freq: Every day | ORAL | 3 refills | Status: DC

## 2020-11-29 MED ORDER — AMOXICILLIN 875 MG PO TABS: 875.0000 mg | ORAL_TABLET | Freq: Two times a day (BID) | ORAL | 0 refills | Status: DC

## 2020-11-29 NOTE — Progress Notes (Signed)
    SUBJECTIVE:   CHIEF COMPLAINT / HPI:   Chief Complaint  Patient presents with   Head congestion   Hypertension     Jessica Herman is a 45 y.o. female here to discuss ongoing congestion and head pressure for the past 2 weeks.  Has been taking Tylenol, Allegra, Flonase and Sudafed without relief.  States that her ears feel clogged mainly on the right side.  Notes a popping sensation.  Has been sneezing, coughing. No fevers.  No recent sick contacts.  She is COVID vaccinated.   PERTINENT  PMH / PSH: reviewed and updated as appropriate   OBJECTIVE:   BP (!) 162/95   Pulse 84   Ht 5\' 5"  (1.651 m)   Wt 182 lb 9.6 oz (82.8 kg)   LMP 10/28/2020   SpO2 100%   BMI 30.39 kg/m    GEN:     alert, well developed female and no distress    HENT:  :  mucus membranes moist, oropharyngeal without lesions , tonsils normal, no  erythema , significant turbinate hypertrophy, minimal space between nares, purulent nasal discharge, bilateral TM normal, mild frontal and maxillary sinus tenderness EYES:   pupils equal and reactive, no scleral injection NECK:  normal ROM, single <1 cm anterior lymphadenopathy  RESP:  clear to auscultation bilaterally, no increased work of breathing  CVS:   regular rate and rhythm Skin:   warm and dry    ASSESSMENT/PLAN:   Acute Rhinosinusitis 45 year old patient with symptoms greater than 10 days.  Exam consistent with rhinosinusitis.  Treat with amoxicillin 875 mg twice daily for 10 days.   Hypertension  BP 162/95.  Not at goal.  Continue amlodipine 10 mg.  Increase losartan to 100 mg to be daily.  Patient can take to 50 mg tablets to complete her home supply.  New 100 mg tablet sent to pharmacy.  -Patient to follow-up in 2 weeks for repeat BMP.  Future order placed today.   Lyndee Hensen, DO PGY-3, [facility] 11/29/2020

## 2020-11-29 NOTE — Patient Instructions (Addendum)
Thank you for coming into the office today.   As discussed, stop by the pharmacy to pick up your prescriptions. Take the antibiotics twice a day for the next 10 days.   Increase losartan 50 mg to 100 mg tablets. You can take 2-50 mg tablets to finish out your current bottle of this medication. Then pick up the new 100 mg prescription was sent to your pharmacy.    Request a refill at your pharmacy as needed    Continue taking your medications daily!   Take Care,   Dr. Susa Simmonds

## 2020-12-03 ENCOUNTER — Encounter: Payer: Self-pay | Admitting: Family Medicine

## 2020-12-03 NOTE — Assessment & Plan Note (Signed)
BP 162/95.  Not at goal.  Continue amlodipine 10 mg.  Increase losartan to 100 mg to be daily.  Patient can take to 50 mg tablets to complete her home supply.  New 100 mg tablet sent to pharmacy.  -Patient to follow-up in 2 weeks for repeat BMP.  Future order placed today.

## 2021-01-17 ENCOUNTER — Other Ambulatory Visit: Payer: Self-pay

## 2021-01-17 ENCOUNTER — Encounter: Payer: Self-pay | Admitting: Family Medicine

## 2021-01-17 DIAGNOSIS — F410 Panic disorder [episodic paroxysmal anxiety] without agoraphobia: Secondary | ICD-10-CM

## 2021-01-17 NOTE — Telephone Encounter (Signed)
Patient calls nurse line requesting a new script to be sent in for Metformin. Patient reports she was taking one per day, however now taking two. Patients last rx was for one per day, therefore she has run out early.  Please advise.

## 2021-01-18 MED ORDER — METFORMIN HCL ER 500 MG PO TB24: 500.0000 mg | ORAL_TABLET | Freq: Two times a day (BID) | ORAL | 2 refills | Status: DC

## 2021-05-14 ENCOUNTER — Encounter (HOSPITAL_COMMUNITY): Payer: Self-pay

## 2021-05-14 ENCOUNTER — Inpatient Hospital Stay (HOSPITAL_COMMUNITY)
Admission: AD | Admit: 2021-05-14 | Discharge: 2021-05-15 | Discharge status: Against Medical Advice | Payer: Commercial Managed Care - HMO | Attending: Family Medicine | Admitting: Family Medicine

## 2021-05-14 ENCOUNTER — Inpatient Hospital Stay (HOSPITAL_COMMUNITY): Payer: Commercial Managed Care - HMO

## 2021-05-14 ENCOUNTER — Other Ambulatory Visit: Payer: Self-pay

## 2021-05-14 DIAGNOSIS — M549 Dorsalgia, unspecified: Secondary | ICD-10-CM | POA: Insufficient documentation

## 2021-05-14 DIAGNOSIS — N939 Abnormal uterine and vaginal bleeding, unspecified: Secondary | ICD-10-CM | POA: Insufficient documentation

## 2021-05-14 DIAGNOSIS — R1031 Right lower quadrant pain: Secondary | ICD-10-CM | POA: Diagnosis not present

## 2021-05-14 DIAGNOSIS — D259 Leiomyoma of uterus, unspecified: Secondary | ICD-10-CM | POA: Diagnosis not present

## 2021-05-14 DIAGNOSIS — Z3202 Encounter for pregnancy test, result negative: Secondary | ICD-10-CM | POA: Insufficient documentation

## 2021-05-14 DIAGNOSIS — D252 Subserosal leiomyoma of uterus: Secondary | ICD-10-CM | POA: Insufficient documentation

## 2021-05-14 LAB — COMPREHENSIVE METABOLIC PANEL
ALT: 13 U/L (ref 0–44)
AST: 15 U/L (ref 15–41)
Albumin: 4 g/dL (ref 3.5–5.0)
Alkaline Phosphatase: 84 U/L (ref 38–126)
Anion gap: 9 (ref 5–15)
BUN: 10 mg/dL (ref 6–20)
CO2: 27 mmol/L (ref 22–32)
Calcium: 9.4 mg/dL (ref 8.9–10.3)
Chloride: 101 mmol/L (ref 98–111)
Creatinine, Ser: 0.72 mg/dL (ref 0.44–1.00)
GFR, Estimated: >60 mL/min (ref >60)
Glucose, Bld: 167 mg/dL — ABNORMAL HIGH (ref 70–99)
Potassium: 3.9 mmol/L (ref 3.5–5.1)
Sodium: 137 mmol/L (ref 135–145)
Total Bilirubin: 0.3 mg/dL (ref 0.3–1.2)
Total Protein: 7.8 g/dL (ref 6.5–8.1)

## 2021-05-14 LAB — URINALYSIS, ROUTINE W REFLEX MICROSCOPIC
Bilirubin Urine: NEGATIVE
Glucose, UA: NEGATIVE mg/dL
Ketones, ur: NEGATIVE mg/dL
Leukocytes,Ua: NEGATIVE
Nitrite: NEGATIVE
Protein, ur: NEGATIVE mg/dL
Specific Gravity, Urine: 1.011 (ref 1.005–1.030)
pH: 7 (ref 5.0–8.0)

## 2021-05-14 LAB — CBC
HCT: 33.2 % — ABNORMAL LOW (ref 36.0–46.0)
Hemoglobin: 10.7 g/dL — ABNORMAL LOW (ref 12.0–15.0)
MCH: 27 pg (ref 26.0–34.0)
MCHC: 32.2 g/dL (ref 30.0–36.0)
MCV: 83.8 fL (ref 80.0–100.0)
Platelets: 465 10*3/uL — ABNORMAL HIGH (ref 150–400)
RBC: 3.96 MIL/uL (ref 3.87–5.11)
RDW: 14.1 % (ref 11.5–15.5)
WBC: 9.1 10*3/uL (ref 4.0–10.5)
nRBC: 0 % (ref 0.0–0.2)

## 2021-05-14 LAB — I-STAT BETA HCG BLOOD, ED (MC, WL, AP ONLY): I-stat hCG, quantitative: <5 m[IU]/mL (ref <5)

## 2021-05-14 LAB — LIPASE, BLOOD: Lipase: 27 U/L (ref 11–51)

## 2021-05-14 LAB — POCT PREGNANCY, URINE: Preg Test, Ur: NEGATIVE

## 2021-05-14 NOTE — ED Provider Triage Note (Signed)
Emergency Medicine Provider Triage Evaluation Note ? ?Jessica Herman , a 46 y.o. female  was evaluated in triage.  Pt complains of right lower quadrant pain started today.  She also states that she has significant vaginal bleeding as well.  She has not had a period in multiple months.  She was concerned that she has having premenopause and that started again today.  She has not had heavy bleeding like this ever in her entire life.  She was initially seen by [facility] and deemed to have a negative pregnancy test.  She was then sent over here for evaluation. ? ?Review of Systems  ?Positive: Vaginal bleeding, abdominal pain ?Negative:  ? ?Physical Exam  ?BP (!) 187/107 (BP Location: Right Arm)   Pulse 93   Temp 98.6 ?F (37 ?C)   Resp 20   Ht '5\' 5"'$  (1.651 m)   Wt 83.4 kg   SpO2 99%   BMI 30.59 kg/m?  ?Gen:   Awake, no distress   ?Resp:  Normal effort  ?MSK:   Moves extremities without difficulty  ?Other:   ? ?Medical Decision Making  ?Medically screening exam initiated at 7:13 PM.  Appropriate orders placed.  Jessica Herman was informed that the remainder of the evaluation will be completed by another provider, this initial triage assessment does not replace that evaluation, and the importance of remaining in the ED until their evaluation is complete. ? ?Pelvic US ordered ?  ?Adolphus Birchwood, PA-C ?05/14/21 1914 ? ?

## 2021-05-14 NOTE — MAU Note (Addendum)
Jessica Herman is a 46 y.o. here in MAU reporting: lower abdominal &  back pain and heavy VB.  Reports symptoms began last night ?LMP: August 2022 ?Onset of complaint: yesterday ?Pain score: abdomen 8, back 6 ?Vitals:  ? 05/14/21 1734  ?BP: (!) 181/100  ?Pulse: 92  ?Resp: 18  ?Temp: 98.4 ?F (36.9 ?C)  ?SpO2: 100%  ?   ? ?Lab orders placed from triage:   UPT ?

## 2021-05-14 NOTE — ED Notes (Signed)
Pt called for vitals, no response. Pt not seen in lobby ?

## 2021-05-14 NOTE — ED Triage Notes (Signed)
Pt brought over from [facility]U for eval of heavy vaginal bleeding, lower abd cramping and back pain. Onset overnight/early AM. Reports no cycle x 5 mos, which she thought was menopause. Went to [facility]U initially and neg preg test, here for further eval. ?

## 2021-05-14 NOTE — Progress Notes (Signed)
Report called to Roselyn Reef, RN ED Charge nurse. ?

## 2021-05-14 NOTE — MAU Provider Note (Signed)
MAU Triage Note ? ?Patient Name: Jessica Herman, female   DOB: 1975-09-18, 46 y.o.  MRN: [id number] ? ? ?S ?Ms. CLETUS MEHLHOFF is a 46 y.o. No obstetric history on file. patient who presents to MAU today with complaint of abdominal pain, back pain, heavy vaginal bleeding.  ? ?O ?BP (!) 181/100 (BP Location: Right Arm)   Pulse 92   Temp 98.4 ?F (36.9 ?C) (Oral)   Resp 18   Ht '5\' 5"'$  (1.651 m)   Wt 83.4 kg   SpO2 100%   BMI 30.59 kg/m?  ?Physical Exam ?Vitals and nursing note reviewed.  ?Constitutional:   ?   Appearance: Normal appearance.  ?Skin: ?   Capillary Refill: Capillary refill takes less than 2 seconds.  ?Neurological:  ?   General: No focal deficit present.  ?   Mental Status: She is alert.  ?Psychiatric:     ?   Mood and Affect: Mood normal.     ?   Behavior: Behavior normal.     ?   Thought Content: Thought content normal.     ?   Judgment: Judgment normal.  ? ? ?A ?Medical screening exam complete ? ?P ?I discussed that the MAU is emergency department for pregnant patients colitis her pregnancy test was negative, we would need to transfer her to the emergency department if she desired to be evaluated.  Patient indicated that she still wanted to be evaluated. ? ?[place] emergency department and discussed with triage EDP.  They accepted transfer. ? ? ?Truett Mainland, DO ?05/14/2021 5:40 PM  ? ?

## 2021-05-14 NOTE — ED Notes (Signed)
Nt saw patient walking out towards the parking lot.  ?

## 2021-07-23 ENCOUNTER — Encounter: Payer: Self-pay | Admitting: Family Medicine

## 2021-08-14 ENCOUNTER — Encounter: Payer: Self-pay | Admitting: *Deleted

## 2021-08-14 ENCOUNTER — Other Ambulatory Visit: Payer: Self-pay | Admitting: Family Medicine

## 2021-08-14 DIAGNOSIS — E1159 Type 2 diabetes mellitus with other circulatory complications: Secondary | ICD-10-CM

## 2021-08-16 ENCOUNTER — Other Ambulatory Visit: Payer: Self-pay | Admitting: Family Medicine

## 2021-08-16 DIAGNOSIS — I10 Essential (primary) hypertension: Secondary | ICD-10-CM

## 2021-08-24 ENCOUNTER — Encounter: Payer: Self-pay | Admitting: Family Medicine

## 2021-08-29 ENCOUNTER — Ambulatory Visit (INDEPENDENT_AMBULATORY_CARE_PROVIDER_SITE_OTHER): Payer: Commercial Managed Care - HMO

## 2021-08-29 DIAGNOSIS — Z111 Encounter for screening for respiratory tuberculosis: Secondary | ICD-10-CM | POA: Diagnosis not present

## 2021-08-30 NOTE — Progress Notes (Signed)
Patient presents in nurse clinic for TB skin test.   PPD placed in left forearm.   Patient to return for reading at 11am on 6/23.

## 2021-08-31 ENCOUNTER — Ambulatory Visit (INDEPENDENT_AMBULATORY_CARE_PROVIDER_SITE_OTHER): Payer: Commercial Managed Care - HMO

## 2021-08-31 DIAGNOSIS — Z111 Encounter for screening for respiratory tuberculosis: Secondary | ICD-10-CM

## 2021-08-31 LAB — TB SKIN TEST
Induration: 0 mm
TB Skin Test: NEGATIVE

## 2021-08-31 NOTE — Progress Notes (Signed)
Patient is here for a PPD read.  It was placed on 08/29/2021 in the left forearm @ 1045 am.    PPD RESULTS:  Result: negative Induration: 0 mm  Letter created and given to patient for documentation purposes. Veronda Prude, RN

## 2022-02-14 ENCOUNTER — Other Ambulatory Visit: Payer: Self-pay | Admitting: Family Medicine

## 2022-05-09 ENCOUNTER — Ambulatory Visit (INDEPENDENT_AMBULATORY_CARE_PROVIDER_SITE_OTHER): Payer: Commercial Managed Care - HMO | Admitting: Family Medicine

## 2022-05-09 ENCOUNTER — Encounter: Payer: Self-pay | Admitting: Family Medicine

## 2022-05-09 VITALS — BP 161/80 | HR 91 | Ht 66.0 in | Wt 189.8 lb

## 2022-05-09 DIAGNOSIS — J069 Acute upper respiratory infection, unspecified: Secondary | ICD-10-CM | POA: Diagnosis not present

## 2022-05-09 MED ORDER — BENZONATATE 100 MG PO CAPS: 100.0000 mg | ORAL_CAPSULE | Freq: Two times a day (BID) | ORAL | 0 refills | Status: DC | PRN

## 2022-05-09 NOTE — Patient Instructions (Signed)
It was nice to meet you today.  I am sorry you are not feeling well. I sent in medication for your cough (Tessalon Perles).  You can try this to see if it helps with cough.  For the pain with coughing, take ibuprofen. For nasal congestion/drainage I recommend trying Afrin.  This is available over-the-counter.  Please call us if you are not better in 5 days, or if your symptoms begin to improve and then worsen.  Please schedule with your primary doctor to follow-up on diabetes.  Be well, Dr. Ardelia Mems

## 2022-05-09 NOTE — Progress Notes (Signed)
  Date of Visit: 05/09/2022   SUBJECTIVE:   HPI:  Jessica Herman presents today for a same day appointment to discuss cough.  Symptoms began Saturday evening over the weekend.  Had chills, body aches, cough, head congestion and nasal drainage.  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  Took a home COVID test yesterday and it was negative.  Fever got as high as 102 over the weekend.  She has tried TheraFlu, other cold/flu medication, Mucinex, Sudafed, and Vicks sinus.  Overall she is feeling somewhat better, the cough persists and this is what really is bothering her.  Also having some pressure in her face/sinuses.  Past medical history: Hypertension, diabetes, depression/anxiety  OBJECTIVE:   BP (!) 161/80   Pulse 91   Ht '5\' 6"'$  (1.676 m)   Wt 189 lb 12.8 oz (86.1 kg)   LMP  (LMP Unknown)   SpO2 97%   BMI 30.63 kg/m  Gen: No acute distress, pleasant, cooperative, appears as though does not feel well HEENT: Normocephalic, atraumatic, TMs clear bilaterally, nares patent, oropharynx clear and moist, no anterior cervical lymphadenopathy.  No sinus tenderness to palpation of maxillary or frontal sinuses. Heart: Regular rate and rhythm, no murmur Lungs: Clear to auscultation bilaterally, normal effort, speaks in full sentences without distress.  Occasional coughing fits, dry sounding cough. Neuro: Alert, grossly nonfocal, speech normal Ext: No edema bilateral lower extremities  ASSESSMENT/PLAN:   Health maintenance:  -Due for multiple health maintenance items, advised to schedule with PCP  Viral URI Symptoms consistent with viral process.  We are unable to do a point-of-care flu test in our lab today.  I had considered this since she does have diabetes and could be considered a candidate for Tamiflu even at this late presentation.  She is 5 days in, so likely low utility for using Tamiflu even if she were flu positive.  We elected to forego flu testing  today.  Will treat symptomatically with ibuprofen, Tessalon Perles, Afrin.  Discussed needing to keep Tessalon away from any small children.  She says no small children live with her.  Work note provided for today and tomorrow.  Discussed return precautions.  [place]. Ardelia Mems, [place]

## 2022-05-16 ENCOUNTER — Encounter: Payer: Self-pay | Admitting: Family Medicine

## 2022-05-16 ENCOUNTER — Ambulatory Visit (INDEPENDENT_AMBULATORY_CARE_PROVIDER_SITE_OTHER): Payer: Commercial Managed Care - HMO | Admitting: Family Medicine

## 2022-05-16 VITALS — BP 138/80 | HR 77 | Ht 66.0 in | Wt 187.0 lb

## 2022-05-16 DIAGNOSIS — J069 Acute upper respiratory infection, unspecified: Secondary | ICD-10-CM | POA: Diagnosis not present

## 2022-05-16 DIAGNOSIS — D649 Anemia, unspecified: Secondary | ICD-10-CM | POA: Diagnosis not present

## 2022-05-16 DIAGNOSIS — E119 Type 2 diabetes mellitus without complications: Secondary | ICD-10-CM

## 2022-05-16 LAB — POCT GLYCOSYLATED HEMOGLOBIN (HGB A1C): HbA1c, POC (controlled diabetic range): 9 % — AB (ref 0.0–7.0)

## 2022-05-16 MED ORDER — PROMETHAZINE VC 6.25-5 MG/5ML PO SYRP
5.0000 mL | ORAL_SOLUTION | Freq: Three times a day (TID) | ORAL | 0 refills | Status: DC | PRN
Start: 2022-05-16 — End: 2023-06-12

## 2022-05-16 NOTE — Progress Notes (Signed)
    SUBJECTIVE:   CHIEF COMPLAINT / HPI:   MS. Junge is a 47 yo woman who presents today for a diabetes check. She would also like to be evaluated for her URI with cough.   DM Metformin once daily in morning BID made stomach hurt - so she self-tapered No other medications  Lab Results  Component Value Date   HGBA1C 9.0 (A) 05/16/2022   HGBA1C 7.8 (A) 09/06/2020   HGBA1C 6.8 03/08/2008   Lab Results  Component Value Date   LDLCALC 75 09/25/2020   CREATININE 0.72 05/14/2021   HTN Losartan 100 mg, amlodipine 10 mg No adverse s/e, no ankle swelling Home BPs systolic in [id number] and [id number]   URI with cough Seen 2/29, dx with viral URI. Since then, have been continuing to experience dry, hacking cough continuously that makes it hard to sleep. Also dry scratchy throat, fatigue, and congestion. Cough predominant.   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   Heavy menses 08/15/2020   Hypertension 02/23/2008    OBJECTIVE:   BP 138/80   Pulse 77   Ht '5\' 6"'$  (1.676 m)   Wt 187 lb (84.8 kg)   LMP  (LMP Unknown)   SpO2 99%   BMI 30.18 kg/m    PHQ-9:     05/16/2022   11:42 AM 05/09/2022   10:15 AM 11/29/2020    3:04 PM  Depression screen PHQ 2/9  Decreased Interest 0 0 0  Down, Depressed, Hopeless 0 0 0  PHQ - 2 Score 0 0 0  Altered sleeping 0 0 3  Tired, decreased energy 0 1 0  Change in appetite 0 0 0  Feeling bad or failure about yourself  0 0 0  Trouble concentrating 0 0 0  Moving slowly or fidgety/restless 0 0 0  Suicidal thoughts 0 0 0  PHQ-9 Score 0 1 3  Difficult doing work/chores  Not difficult at all Not difficult at all     Physical Exam General: Awake, alert, oriented HEENT: PERRL, bilateral TM pearly pink and flat, bilateral external auditory canals with minimal cerumen burden, no lesions, nasal mucosa slightly edematous, oral mucosa pink, moist, without lesion, intact dentition without obvious cavity Lymph: No palpable lymphedema of head or neck Cardiovascular: Regular rate and rhythm, S1 and S2 present, no murmurs auscultated Respiratory: Lung fields clear to auscultation bilaterally  ASSESSMENT/PLAN:   Diabetes mellitus without complication (HCC) Uncontrolled. Patient self-tapered down to 500 mg metformin daily. Discussed importance of control. Patient to increase metformin to 1,000 mg qAM. Introduced other medications such as SGLT-2 (although patient hesitant due to increased risk of UTI) and GLP-1 (patient hesitant due to self-injection). Patient to return for visit with Dr. Valentina Lucks to further discuss additional T2DM medications and possibly undergo training for GLP-1 injectable if that is what she chooses to use.   URI with cough and congestion Continued cough and congestion after viral URI. No red flags or s/s of secondary bacterial infection. Continue supportive conservative care. Will send cough syrup for PRN use.      Ezequiel Essex, MD [place]

## 2022-05-16 NOTE — Assessment & Plan Note (Signed)
Continued cough and congestion after viral URI. No red flags or s/s of secondary bacterial infection. Continue supportive conservative care. Will send cough syrup for PRN use.

## 2022-05-16 NOTE — Assessment & Plan Note (Signed)
Uncontrolled. Patient self-tapered down to 500 mg metformin daily. Discussed importance of control. Patient to increase metformin to 1,000 mg qAM. Introduced other medications such as SGLT-2 (although patient hesitant due to increased risk of UTI) and GLP-1 (patient hesitant due to self-injection). Patient to return for visit with Dr. Valentina Lucks to further discuss additional T2DM medications and possibly undergo training for GLP-1 injectable if that is what she chooses to use.

## 2022-05-16 NOTE — Patient Instructions (Signed)
It was wonderful to see you today. Thank you for allowing me to be a part of your care. Below is a short summary of what we discussed at your visit today:  Cough and congestion I have sent cough syrup to your pharmacy.  Please use is only at nighttime to help you sleep without cough.  During the day use things like humidifier, nasal spray, Flonase, honey, throat lozenges, and humidifier to help relieve this.  The cough can last weeks unfortunately.  Please return if you develop fevers, shortness of breath, other signs of bacterial infection such as pneumonia.  Diabetes Take 2 metformin tablets every morning. Please make an appointment on your way out to talk with the pharmacy team sometime in the next week or 2 about additional diabetes medicines you can start.  The biggest ones are something like Jardiance but helps you pee out sugar.  The other 1 is a once weekly injectable like Trulicity or Ozempic.  Annual physical Today collected blood work.If the results are normal, I will send you a letter or MyChart message. If the results are abnormal, I will give you a call.    Please bring all of your medications to every appointment!  If you have any questions or concerns, please do not hesitate to contact us via phone or MyChart message.   Ezequiel Essex, MD

## 2022-05-17 ENCOUNTER — Other Ambulatory Visit: Payer: Self-pay | Admitting: Family Medicine

## 2022-05-17 DIAGNOSIS — D649 Anemia, unspecified: Secondary | ICD-10-CM

## 2022-05-19 LAB — LIPID PANEL
Chol/HDL Ratio: 3.5 ratio (ref 0.0–4.4)
Cholesterol, Total: 145 mg/dL (ref 100–199)
HDL: 41 mg/dL (ref >39)
LDL Chol Calc (NIH): 89 mg/dL (ref 0–99)
Triglycerides: 78 mg/dL (ref 0–149)
VLDL Cholesterol Cal: 15 mg/dL (ref 5–40)

## 2022-05-19 LAB — CBC
Hematocrit: 33.6 % — ABNORMAL LOW (ref 34.0–46.6)
Hemoglobin: 10.8 g/dL — ABNORMAL LOW (ref 11.1–15.9)
MCH: 26.7 pg (ref 26.6–33.0)
MCHC: 32.1 g/dL (ref 31.5–35.7)
MCV: 83 fL (ref 79–97)
Platelets: 375 10*3/uL (ref 150–450)
RBC: 4.04 x10E6/uL (ref 3.77–5.28)
RDW: 14.6 % (ref 11.7–15.4)
WBC: 7.6 10*3/uL (ref 3.4–10.8)

## 2022-05-19 LAB — BASIC METABOLIC PANEL
BUN/Creatinine Ratio: 8 — ABNORMAL LOW (ref 9–23)
BUN: 6 mg/dL (ref 6–24)
CO2: 21 mmol/L (ref 20–29)
Calcium: 9.1 mg/dL (ref 8.7–10.2)
Chloride: 99 mmol/L (ref 96–106)
Creatinine, Ser: 0.75 mg/dL (ref 0.57–1.00)
Glucose: 175 mg/dL — ABNORMAL HIGH (ref 70–99)
Potassium: 4.5 mmol/L (ref 3.5–5.2)
Sodium: 138 mmol/L (ref 134–144)
eGFR: 100 mL/min/{1.73_m2} (ref >59)

## 2022-05-19 LAB — MICROALBUMIN / CREATININE URINE RATIO
Creatinine, Urine: 144.3 mg/dL
Microalb/Creat Ratio: 13 mg/g creat (ref 0–29)
Microalbumin, Urine: 19.1 ug/mL

## 2022-05-20 ENCOUNTER — Telehealth: Payer: Self-pay | Admitting: Family Medicine

## 2022-05-20 NOTE — Telephone Encounter (Signed)
Called patient to discuss lab work. No answer, left HIPAA compliant voicemail. Glucose elevated on BMP, otherwise labs okay. LDL above goal of <70 for diabetics. Anemic without documented iron deficiency, will add on iron panel to blood work.   Recommend starting moderate intensity statin, could possibly find combination pill with metformin or other medication she is already on.  She sees Dr. Valentina Lucks on 3/15 for diabetes discussion.  Hopefully she can discuss statin with him as well if I cannot get her on the phone.  Ezequiel Essex, MD   The 10-year ASCVD risk score (Arnett DK, et al., 2019) is: 9.8%   Values used to calculate the score:     Age: 47 years     Sex: Female     Is Non-Hispanic African American: Yes     Diabetic: Yes     Tobacco smoker: No     Systolic Blood Pressure: 0000000 mmHg     Is BP treated: Yes     HDL Cholesterol: 41 mg/dL     Total Cholesterol: 145 mg/dL

## 2022-05-21 ENCOUNTER — Encounter: Payer: Self-pay | Admitting: Family Medicine

## 2022-05-21 ENCOUNTER — Telehealth: Payer: Self-pay | Admitting: Family Medicine

## 2022-05-21 DIAGNOSIS — D508 Other iron deficiency anemias: Secondary | ICD-10-CM

## 2022-05-21 LAB — IRON,TIBC AND FERRITIN PANEL
Ferritin: 12 ng/mL — ABNORMAL LOW (ref 15–150)
Iron Saturation: 12 % — ABNORMAL LOW (ref 15–55)
Iron: 38 ug/dL (ref 27–159)
Total Iron Binding Capacity: 326 ug/dL (ref 250–450)
UIBC: 288 ug/dL (ref 131–425)

## 2022-05-21 LAB — SPECIMEN STATUS REPORT

## 2022-05-21 MED ORDER — FERROUS GLUCONATE 324 (38 FE) MG PO TABS: 324.0000 mg | ORAL_TABLET | Freq: Every day | ORAL | 3 refills | Status: DC

## 2022-05-21 NOTE — Telephone Encounter (Signed)
Called to discuss lab results. NO answer, left VM.   LDL above goal of <70 for diabetics. Could use statin. Will wait to prescribe until I can discuss with patient.   Mild anemia with Hgb 10.8. Iron studies show iron deficiency. Will recommend iron supplementation with recheck in a month or so.   Glucose elevated on BMP, c/w T2DM, otherwise labs okay.    Ezequiel Essex, MD   The 10-year ASCVD risk score (Arnett DK, et al., 2019) is: 9.8%   Values used to calculate the score:     Age: 47 years     Sex: Female     Is Non-Hispanic African American: Yes     Diabetic: Yes     Tobacco smoker: No     Systolic Blood Pressure: 0000000 mmHg     Is BP treated: Yes     HDL Cholesterol: 41 mg/dL     Total Cholesterol: 145 mg/dL

## 2022-05-24 ENCOUNTER — Encounter: Payer: Self-pay | Admitting: Pharmacist

## 2022-05-24 ENCOUNTER — Ambulatory Visit (INDEPENDENT_AMBULATORY_CARE_PROVIDER_SITE_OTHER): Payer: Commercial Managed Care - HMO | Admitting: Pharmacist

## 2022-05-24 VITALS — Wt 189.0 lb

## 2022-05-24 DIAGNOSIS — E119 Type 2 diabetes mellitus without complications: Secondary | ICD-10-CM | POA: Diagnosis not present

## 2022-05-24 MED ORDER — TRULICITY 0.75 MG/0.5ML ~~LOC~~ SOAJ: 0.7500 mg | SUBCUTANEOUS | 3 refills | Status: DC

## 2022-05-24 MED ORDER — ATORVASTATIN CALCIUM 40 MG PO TABS: 40.0000 mg | ORAL_TABLET | Freq: Every day | ORAL | 3 refills | Status: DC

## 2022-05-24 NOTE — Assessment & Plan Note (Addendum)
Diabetes longstanding. Patient is able to verbalize appropriate hypoglycemia management plan. Control is suboptimal due to elevated A1c. -Started GLP-1 Trulicity (dulaglutide) 0.75 mg weekly -Continued metformin 1000 mg QAM -Patient educated on purpose and potential adverse effects of Trulicity (nausea, vomiting, early satiety) -Patient was trained on how to use properly use Trulicity  injectable pens  -Extensively discussed pathophysiology of diabetes, recommended lifestyle interventions, dietary effects on blood sugar control.  Patient is not currently monitoring blood sugar and is uninterested in CGM monitoring study at this time.  -Counseled on s/sx of and management of hypoglycemia.  -Next A1c anticipated 08/16/22  ASCVD risk - primary prevention in patient with diabetes. Last LDL is not at goal of <70 mg/dL. High intensity statin indicated.  -Started atorvastatin 40 mg - Patient educated on purpose, proper use and potential adverse effects of myalgias.

## 2022-05-24 NOTE — Patient Instructions (Addendum)
It was great to see you today! We started a few new medications for you today.   Medication Changes: START ferrous gluconate 324 mg - take one tablet once daily. You may start to experience constipation while taking this medication. You can use an over-the-counter stool softener like Docusate to help avoid this!  START atorvastatin 40 mg once daily  START Trulicity (dulaglutide) 0.75mg  once weekly  CONTINUE metformin 1000 mg once daily in the morning   If you start to experience any nausea or vomiting after you start your Trulicity injection, please call us and let us know!
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
## 2022-06-05 NOTE — Progress Notes (Signed)
Reviewed and agree with Dr Koval's plan.   

## 2022-06-13 ENCOUNTER — Ambulatory Visit: Payer: Commercial Managed Care - HMO | Admitting: Pharmacist

## 2022-06-20 ENCOUNTER — Other Ambulatory Visit: Payer: Self-pay | Admitting: Family Medicine

## 2022-06-20 ENCOUNTER — Other Ambulatory Visit (HOSPITAL_COMMUNITY): Payer: Self-pay

## 2022-06-20 DIAGNOSIS — E1159 Type 2 diabetes mellitus with other circulatory complications: Secondary | ICD-10-CM

## 2022-06-24 ENCOUNTER — Other Ambulatory Visit (HOSPITAL_COMMUNITY): Payer: Self-pay

## 2022-06-24 ENCOUNTER — Telehealth: Payer: Self-pay

## 2022-06-24 NOTE — Telephone Encounter (Signed)
A Prior Authorization was initiated for this patients TRULICITY through CoverMyMeds.   Key: [id number]

## 2022-06-27 NOTE — Telephone Encounter (Signed)
Prior Auth for patients medication TRULICITY approved by [employer] from 06/24/22 to 06/24/23.  CoverMyMeds Key: [facility] Case ID #: [id number]

## 2022-06-28 ENCOUNTER — Encounter (HOSPITAL_COMMUNITY): Payer: Self-pay

## 2022-06-28 ENCOUNTER — Ambulatory Visit (HOSPITAL_COMMUNITY)
Admission: EM | Admit: 2022-06-28 | Discharge: 2022-06-28 | Disposition: A | Discharge status: Home / Self Care | Payer: Commercial Managed Care - HMO | Attending: Emergency Medicine | Admitting: Emergency Medicine

## 2022-06-28 DIAGNOSIS — R112 Nausea with vomiting, unspecified: Secondary | ICD-10-CM

## 2022-06-28 LAB — POCT URINALYSIS DIP (MANUAL ENTRY)
Bilirubin, UA: NEGATIVE
Glucose, UA: 250 mg/dL — AB
Leukocytes, UA: NEGATIVE
Nitrite, UA: NEGATIVE
Protein Ur, POC: 30 mg/dL — AB
Spec Grav, UA: 1.01 (ref 1.010–1.025)
Urobilinogen, UA: 0.2 E.U./dL
pH, UA: 7.5 (ref 5.0–8.0)

## 2022-06-28 LAB — POCT URINE PREGNANCY: Preg Test, Ur: NEGATIVE

## 2022-06-28 LAB — POCT FASTING CBG KUC MANUAL ENTRY: POCT Glucose (KUC): 243 mg/dL — AB (ref 70–99)

## 2022-06-28 MED ORDER — ONDANSETRON 4 MG PO TBDP
ORAL_TABLET | ORAL | Status: AC
  Filled 2022-06-28: qty 1

## 2022-06-28 MED ORDER — ONDANSETRON 4 MG PO TBDP
4.0000 mg | ORAL_TABLET | Freq: Once | ORAL | Status: AC
  Administered 2022-06-28: 4 mg via ORAL

## 2022-06-28 MED ORDER — ONDANSETRON HCL 4 MG/2ML IJ SOLN
INTRAMUSCULAR | Status: AC
  Filled 2022-06-28: qty 2

## 2022-06-28 MED ORDER — ONDANSETRON HCL 4 MG/2ML IJ SOLN
4.0000 mg | Freq: Once | INTRAMUSCULAR | Status: AC
  Administered 2022-06-28: 4 mg via INTRAMUSCULAR

## 2022-06-28 MED ORDER — ONDANSETRON 4 MG PO TBDP: 4.0000 mg | ORAL_TABLET | Freq: Once | ORAL | Status: DC

## 2022-06-28 MED ORDER — ONDANSETRON 4 MG PO TBDP: 4.0000 mg | ORAL_TABLET | Freq: Four times a day (QID) | ORAL | 0 refills | Status: DC | PRN

## 2022-06-28 NOTE — ED Triage Notes (Signed)
Pt states that she started throwing up this morning. Started diarrhea about a hour ago. Took pepto. Threw it up. Drank gingerale. Threw up.  No fever. No headache.
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
## 2022-06-28 NOTE — Discharge Instructions (Addendum)
You can use the zofran every 6 hours to settle the stomach. Please do not take another dose until around 10:30 tonight.  Make sure you are drinking lots of water! Bland diet as tolerated. (Salt[place], toast, etc)  If at any point you cannot keep fluids down, despite use of the medicine, please go to the emergency department.

## 2022-07-01 ENCOUNTER — Emergency Department (HOSPITAL_COMMUNITY): Payer: Commercial Managed Care - HMO

## 2022-07-01 ENCOUNTER — Emergency Department (HOSPITAL_COMMUNITY)
Admission: EM | Admit: 2022-07-01 | Discharge: 2022-07-01 | Disposition: A | Discharge status: Home / Self Care | Payer: Commercial Managed Care - HMO | Attending: Emergency Medicine | Admitting: Emergency Medicine

## 2022-07-01 ENCOUNTER — Encounter (HOSPITAL_COMMUNITY): Payer: Self-pay

## 2022-07-01 DIAGNOSIS — R531 Weakness: Secondary | ICD-10-CM | POA: Diagnosis not present

## 2022-07-01 DIAGNOSIS — R112 Nausea with vomiting, unspecified: Secondary | ICD-10-CM

## 2022-07-01 LAB — CBC
HCT: 39.3 % (ref 36.0–46.0)
Hemoglobin: 12.9 g/dL (ref 12.0–15.0)
MCH: 27.4 pg (ref 26.0–34.0)
MCHC: 32.8 g/dL (ref 30.0–36.0)
MCV: 83.6 fL (ref 80.0–100.0)
Platelets: 447 10*3/uL — ABNORMAL HIGH (ref 150–400)
RBC: 4.7 MIL/uL (ref 3.87–5.11)
RDW: 14.4 % (ref 11.5–15.5)
WBC: 12.7 10*3/uL — ABNORMAL HIGH (ref 4.0–10.5)
nRBC: 0 % (ref 0.0–0.2)

## 2022-07-01 LAB — COMPREHENSIVE METABOLIC PANEL
ALT: 14 U/L (ref 0–44)
AST: 15 U/L (ref 15–41)
Albumin: 4 g/dL (ref 3.5–5.0)
Alkaline Phosphatase: 99 U/L (ref 38–126)
Anion gap: 14 (ref 5–15)
BUN: 18 mg/dL (ref 6–20)
CO2: 26 mmol/L (ref 22–32)
Calcium: 9 mg/dL (ref 8.9–10.3)
Chloride: 90 mmol/L — ABNORMAL LOW (ref 98–111)
Creatinine, Ser: 0.89 mg/dL (ref 0.44–1.00)
GFR, Estimated: >60 mL/min (ref >60)
Glucose, Bld: 302 mg/dL — ABNORMAL HIGH (ref 70–99)
Potassium: 3 mmol/L — ABNORMAL LOW (ref 3.5–5.1)
Sodium: 130 mmol/L — ABNORMAL LOW (ref 135–145)
Total Bilirubin: 0.9 mg/dL (ref 0.3–1.2)
Total Protein: 8.2 g/dL — ABNORMAL HIGH (ref 6.5–8.1)

## 2022-07-01 LAB — URINALYSIS, ROUTINE W REFLEX MICROSCOPIC
Bilirubin Urine: NEGATIVE
Glucose, UA: >=500 mg/dL — AB
Ketones, ur: NEGATIVE mg/dL
Nitrite: NEGATIVE
Protein, ur: 100 mg/dL — AB
Specific Gravity, Urine: 1.023 (ref 1.005–1.030)
WBC, UA: >50 WBC/hpf (ref 0–5)
pH: 6 (ref 5.0–8.0)

## 2022-07-01 LAB — LIPASE, BLOOD: Lipase: 31 U/L (ref 11–51)

## 2022-07-01 LAB — TROPONIN I (HIGH SENSITIVITY)
Troponin I (High Sensitivity): 14 ng/L (ref <18)
Troponin I (High Sensitivity): 15 ng/L (ref <18)

## 2022-07-01 LAB — I-STAT BETA HCG BLOOD, ED (MC, WL, AP ONLY): I-stat hCG, quantitative: <5 m[IU]/mL (ref <5)

## 2022-07-01 MED ORDER — SODIUM CHLORIDE 0.9 % IV BOLUS
1000.0000 mL | Freq: Once | INTRAVENOUS | Status: AC
  Administered 2022-07-01: 1000 mL via INTRAVENOUS

## 2022-07-01 MED ORDER — ALUM & MAG HYDROXIDE-SIMETH 200-200-20 MG/5ML PO SUSP
30.0000 mL | Freq: Once | ORAL | Status: AC
  Administered 2022-07-01: 30 mL via ORAL
  Filled 2022-07-01: qty 30

## 2022-07-01 MED ORDER — METOCLOPRAMIDE HCL 5 MG/ML IJ SOLN
10.0000 mg | Freq: Once | INTRAMUSCULAR | Status: AC
  Administered 2022-07-01: 10 mg via INTRAVENOUS
  Filled 2022-07-01: qty 2

## 2022-07-01 MED ORDER — DIPHENHYDRAMINE HCL 50 MG/ML IJ SOLN
25.0000 mg | Freq: Once | INTRAMUSCULAR | Status: AC
  Administered 2022-07-01: 25 mg via INTRAVENOUS
  Filled 2022-07-01: qty 1

## 2022-07-01 MED ORDER — METOCLOPRAMIDE HCL 10 MG PO TABS: 10.0000 mg | ORAL_TABLET | Freq: Four times a day (QID) | ORAL | 0 refills | Status: DC

## 2022-07-01 NOTE — ED Provider Notes (Signed)
[place] EMERGENCY DEPARTMENT AT [facility] Provider Note   CSN: [id number] Arrival date & time: 07/01/22  1605     History  Chief Complaint  Patient presents with   Weakness    Jessica Herman is a 47 y.o. female.  47 yo F with a chief complaint of nausea and vomiting.  Going on for a few days now.  Has not really been able to eat or drink anything since the onset.  Every time she tries she ends up feeling uncomfortable and ends of vomiting.  She had gone to urgent care and was prescribed Zofran and has taken it without significant improvement   Weakness      Home Medications Prior to Admission medications   Medication Sig Start Date End Date Taking? Authorizing Provider  metoCLOPramide (REGLAN) 10 MG tablet Take 1 tablet (10 mg total) by mouth every 6 (six) hours. 07/01/22  Yes Melene Plan, DO  amLODipine (NORVASC) 10 MG tablet TAKE 1 TABLET(10 MG) BY MOUTH AT BEDTIME 06/21/22   Fayette Pho, MD  atorvastatin (LIPITOR) 40 MG tablet Take 1 tablet (40 mg total) by mouth daily. 05/24/22   McDiarmid, Leighton Roach, MD  benzonatate (TESSALON) 100 MG capsule Take 1 capsule (100 mg total) by mouth 2 (two) times daily as needed for cough. 05/09/22   Latrelle Dodrill, MD  Dulaglutide (TRULICITY) 0.75 MG/0.5ML SOPN Inject 0.75 mg into the skin once a week. 05/24/22   McDiarmid, Leighton Roach, MD  ferrous gluconate (FERGON) 324 MG tablet Take 1 tablet (324 mg total) by mouth daily with breakfast. If you experience constipation, reduce use to every other day. 05/21/22   Fayette Pho, MD  losartan (COZAAR) 100 MG tablet TAKE 1 TABLET(100 MG) BY MOUTH AT BEDTIME 08/16/21   Autry-Lott, Randa Evens, DO  metFORMIN (GLUCOPHAGE-XR) 500 MG 24 hr tablet TAKE 1 TABLET(500 MG) BY MOUTH IN THE MORNING AND AT BEDTIME Patient taking differently: 1,000 mg daily. 02/14/22   Fayette Pho, MD  ondansetron (ZOFRAN-ODT) 4 MG disintegrating tablet Take 1 tablet (4 mg total) by mouth every 6 (six) hours as needed  for nausea or vomiting. 06/28/22   Rising, Lurena Joiner, PA-C  promethazine-phenylephrine 6.25-5 MG/5ML SYRP Take 5 mLs by mouth every 8 (eight) hours as needed for congestion. 05/16/22   Fayette Pho, MD      Allergies    Patient has no known allergies.    Review of Systems   Review of Systems  Neurological:  Positive for weakness.    Physical Exam Updated Vital Signs BP (!) 178/99 (BP Location: Left Arm)   Pulse 88   Temp 99.4 F (37.4 C) (Oral)   Resp 16   LMP 06/27/2022 (Exact Date)   SpO2 100%  Physical Exam Vitals and nursing note reviewed.  Constitutional:      General: She is not in acute distress.    Appearance: She is well-developed. She is not diaphoretic.  HENT:     Head: Normocephalic and atraumatic.  Eyes:     Pupils: Pupils are equal, round, and reactive to light.  Cardiovascular:     Rate and Rhythm: Normal rate and regular rhythm.     Heart sounds: No murmur heard.    No friction rub. No gallop.  Pulmonary:     Effort: Pulmonary effort is normal.     Breath sounds: No wheezing or rales.  Abdominal:     General: There is no distension.     Palpations: Abdomen is soft.  Tenderness: There is no abdominal tenderness.  Musculoskeletal:        General: No tenderness.     Cervical back: Normal range of motion and neck supple.  Skin:    General: Skin is warm and dry.  Neurological:     Mental Status: She is alert and oriented to person, place, and time.  Psychiatric:        Behavior: Behavior normal.     ED Results / Procedures / Treatments   Labs (all labs ordered are listed, but only abnormal results are displayed) Labs Reviewed  CBC - Abnormal; Notable for the following components:      Result Value   WBC 12.7 (*)    Platelets 447 (*)    All other components within normal limits  URINALYSIS, ROUTINE W REFLEX MICROSCOPIC - Abnormal; Notable for the following components:   APPearance HAZY (*)    Glucose, UA >=500 (*)    Hgb urine dipstick LARGE  (*)    Protein, ur 100 (*)    Leukocytes,Ua SMALL (*)    Bacteria, UA FEW (*)    All other components within normal limits  COMPREHENSIVE METABOLIC PANEL - Abnormal; Notable for the following components:   Sodium 130 (*)    Potassium 3.0 (*)    Chloride 90 (*)    Glucose, Bld 302 (*)    Total Protein 8.2 (*)    All other components within normal limits  LIPASE, BLOOD  I-STAT BETA HCG BLOOD, ED ([facility], [facility], [facility] ONLY)  TROPONIN I (HIGH SENSITIVITY)  TROPONIN I (HIGH SENSITIVITY)    EKG EKG Interpretation  Date/Time:  Monday July 01 2022 17:34:25 EDT Ventricular Rate:  93 PR Interval:  126 QRS Duration: 78 QT Interval:  364 QTC Calculation: 452 R Axis:   45 Text Interpretation: Sinus rhythm with Premature atrial complexes with Abberant conduction Otherwise normal ECG sinus arrythmia w pvc Otherwise no significant change Confirmed by Melene Plan [phone]) on 07/01/2022 5:40:10 PM  Radiology DG Chest Portable 1 View  Result Date: 07/01/2022 CLINICAL DATA:  Shortness of breath EXAM: PORTABLE CHEST 1 VIEW COMPARISON:  02/18/2009 FINDINGS: The heart size and mediastinal contours are within normal limits. Both lungs are clear. The visualized skeletal structures are unremarkable. IMPRESSION: No active disease. Electronically Signed   By: Burman Nieves M.D.   On: 07/01/2022 17:32    Procedures Procedures    Medications Ordered in ED Medications  metoCLOPramide (REGLAN) injection 10 mg (10 mg Intravenous Given 07/01/22 1826)  diphenhydrAMINE (BENADRYL) injection 25 mg (25 mg Intravenous Given 07/01/22 1830)  sodium chloride 0.9 % bolus 1,000 mL (1,000 mLs Intravenous New Bag/Given 07/01/22 1834)  alum & mag hydroxide-simeth (MAALOX/MYLANTA) 200-200-20 MG/5ML suspension 30 mL (30 mLs Oral Given 07/01/22 1939)    ED Course/ Medical Decision Making/ A&P                             Medical Decision Making Risk OTC drugs. Prescription drug management.   47 yo F with a cc of n/v.   Patient with no significant pain on exam.  Labs, fluids, reassess.    LFTs and lipase unremarkable.  Patient feeling better tolerating by mouth.  Will discharge home.  PCP follow-up.  8:28 PM:  I have discussed the diagnosis/risks/treatment options with the patient.  Evaluation and diagnostic testing in the emergency department does not suggest an emergent condition requiring admission or immediate intervention beyond what has been  performed at this time.  They will follow up with PCP. We also discussed returning to the ED immediately if new or worsening sx occur. We discussed the sx which are most concerning (e.g., sudden worsening pain, fever, inability to tolerate by mouth) that necessitate immediate return. Medications administered to the patient during their visit and any new prescriptions provided to the patient are listed below.  Medications given during this visit Medications  metoCLOPramide (REGLAN) injection 10 mg (10 mg Intravenous Given 07/01/22 1826)  diphenhydrAMINE (BENADRYL) injection 25 mg (25 mg Intravenous Given 07/01/22 1830)  sodium chloride 0.9 % bolus 1,000 mL (1,000 mLs Intravenous New Bag/Given 07/01/22 1834)  alum & mag hydroxide-simeth (MAALOX/MYLANTA) 200-200-20 MG/5ML suspension 30 mL (30 mLs Oral Given 07/01/22 1939)     The patient appears reasonably screen and/or stabilized for discharge and I doubt any other medical condition or other [facility] requiring further screening, evaluation, or treatment in the ED at this time prior to discharge.          Final Clinical Impression(s) / ED Diagnoses Final diagnoses:  Nausea and vomiting in adult    Rx / DC Orders ED Discharge Orders          Ordered    metoCLOPramide (REGLAN) 10 MG tablet  Every 6 hours        07/01/22 2026              Melene Plan, DO 07/01/22 2028

## 2022-07-01 NOTE — ED Provider Triage Note (Signed)
Emergency Medicine Provider Triage Evaluation Note  Jessica Herman , a 47 y.o. female  was evaluated in triage.  Pt complains of weakness.  Started over the weekend.  Weakness is generalized.  States she had a couple days prior of nausea and vomiting.  Denies diarrhea and abdominal pain.  States the last couple days she is just felt weak, low energy and dizzy.  Dizziness with loss of balance sensation.  Denies room spinning sensation.  Denies chest pain.  Review of Systems  Positive: See above Negative: See above  Physical Exam  BP (!) 147/109 (BP Location: Right Arm)   Pulse 99   Temp 99.3 F (37.4 C)   Resp 15   LMP 06/27/2022 (Exact Date)   SpO2 99%  Gen:   Awake, no distress   Resp:  Normal effort  MSK:   Moves extremities without difficulty  Other:    Medical Decision Making  Medically screening exam initiated at 5:14 PM.  Appropriate orders placed.  Jessica Herman was informed that the remainder of the evaluation will be completed by another provider, this initial triage assessment does not replace that evaluation, and the importance of remaining in the ED until their evaluation is complete.  Work up started   Jessica Eagle, PA-C 07/01/22 1715

## 2022-07-01 NOTE — ED Triage Notes (Signed)
Pt c/o continued weakness since Friday. Seen at [facility] for same, DC w zofran for symptoms, advised to come to ED if symptoms did not resolve. Pt states she hasn't vomited since last night but feels like she has centralized CP/ indigestion, "no appetite" today for fear of N/V

## 2022-07-01 NOTE — ED Notes (Signed)
Daughter Jessica Herman [phone] would like an update asap and to talk to her mother immediately

## 2022-07-01 NOTE — Discharge Instructions (Signed)
Please return for any abdominal pain fever or inability eat or drink.  Try pepcid or tagamet up to twice a day.  Try to avoid things that may make this worse, most commonly these are spicy foods tomato based products fatty foods chocolate and peppermint.  Alcohol and tobacco can also make this worse.  Return to the emergency department for sudden worsening pain fever or inability to eat or drink.

## 2022-09-17 ENCOUNTER — Other Ambulatory Visit: Payer: Self-pay | Admitting: Family Medicine

## 2022-09-17 DIAGNOSIS — I10 Essential (primary) hypertension: Secondary | ICD-10-CM

## 2022-10-23 IMAGING — US US PELVIS COMPLETE TRANSABD/TRANSVAG W DUPLEX AND/OR DOPPLER
1 series · 13 of 25 positions shown · non-contrast
Comparison: CT 07/09/2012

CLINICAL DATA: Right lower quadrant pain

EXAM:
TRANSABDOMINAL AND TRANSVAGINAL ULTRASOUND OF PELVIS
DOPPLER ULTRASOUND OF OVARIES
TECHNIQUE: Both transabdominal and transvaginal ultrasound examinations of the
pelvis were performed. Transabdominal technique was performed for
global imaging of the pelvis including uterus, ovaries, adnexal
regions, and pelvic cul-de-sac.
It was necessary to proceed with endovaginal exam following the
transabdominal exam to visualize the uterus endometrium ovaries.
Color and duplex Doppler ultrasound was utilized to evaluate blood
flow to the ovaries.

[Series 1: us pelvic complete w transvaginal and torsion righ · 13 of 44 slices shown]
[im 1/44]
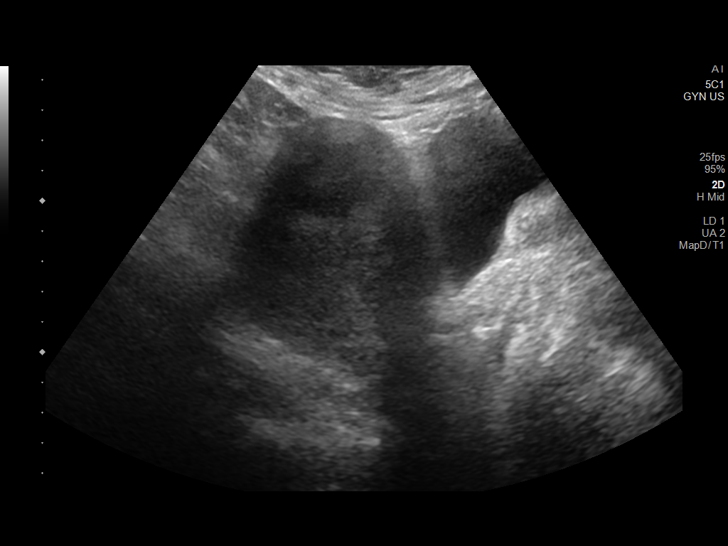
[im 4/44]
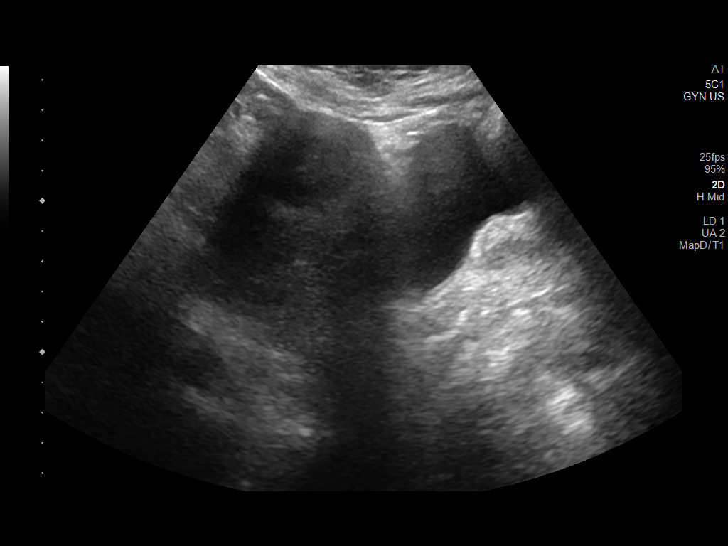
[im 8/44]
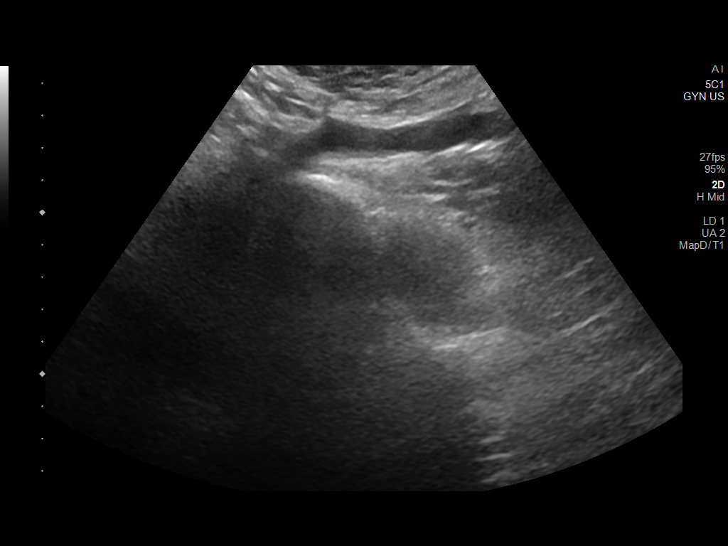
[im 11/44]
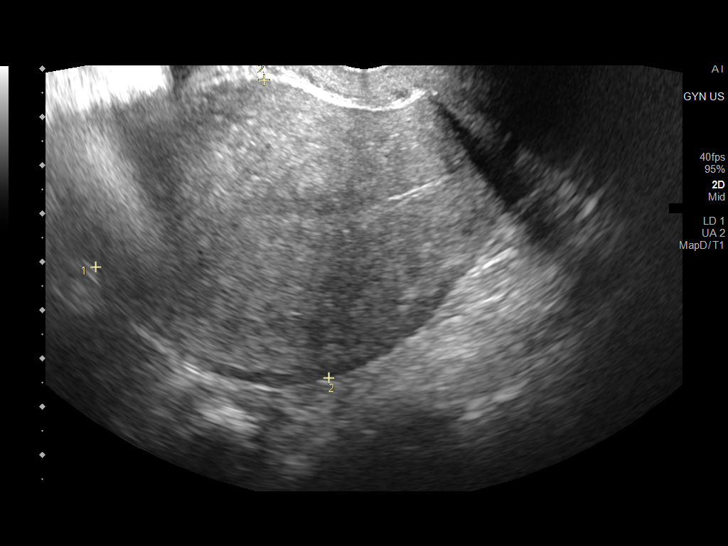
[im 15/44]
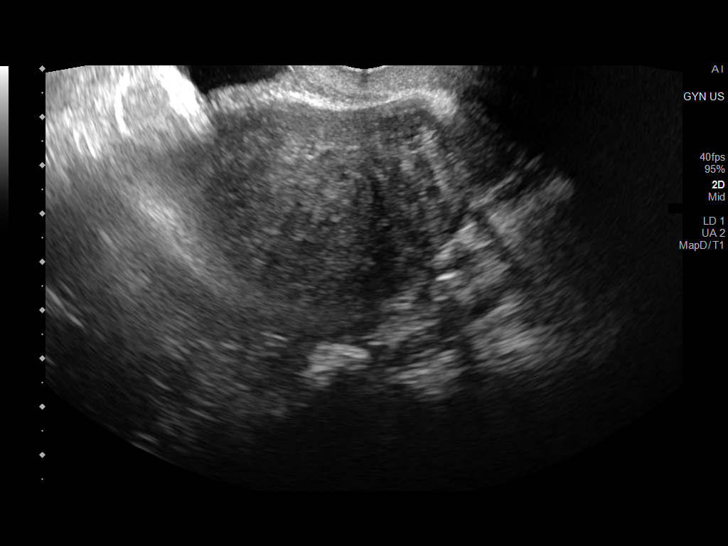
[im 18/44]
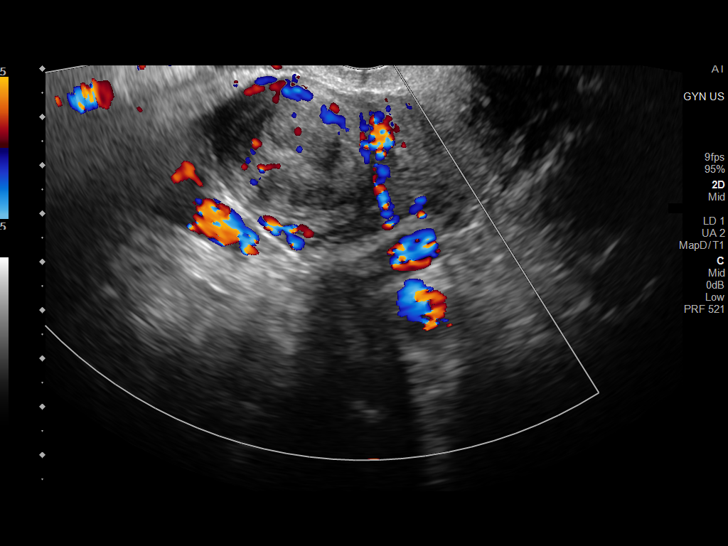
[im 22/44]
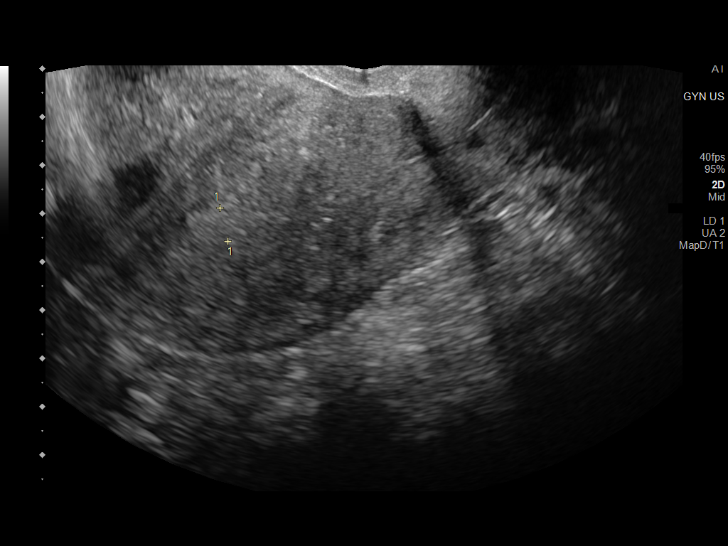
[im 26/44]
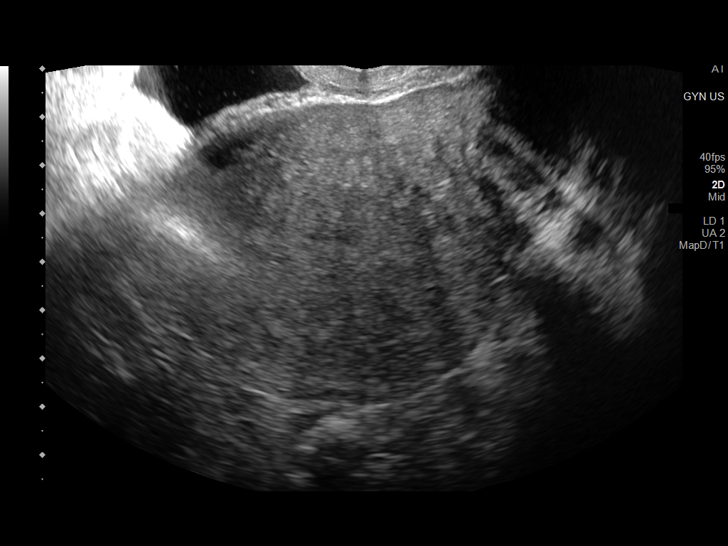
[im 29/44]
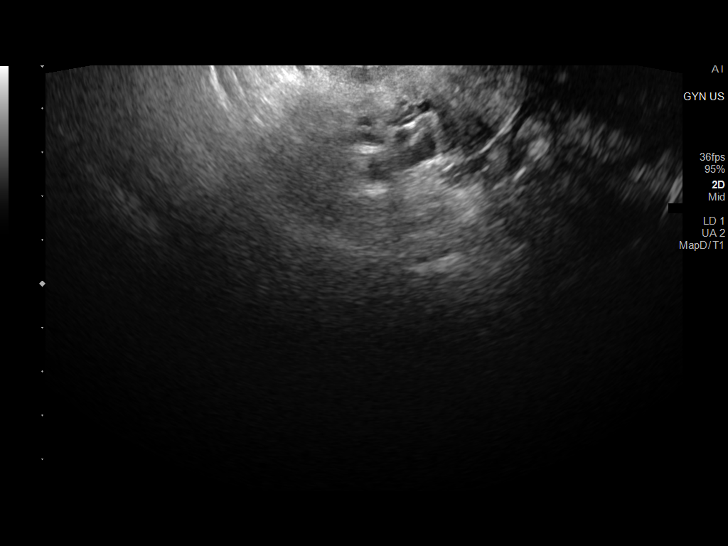
[im 33/44]
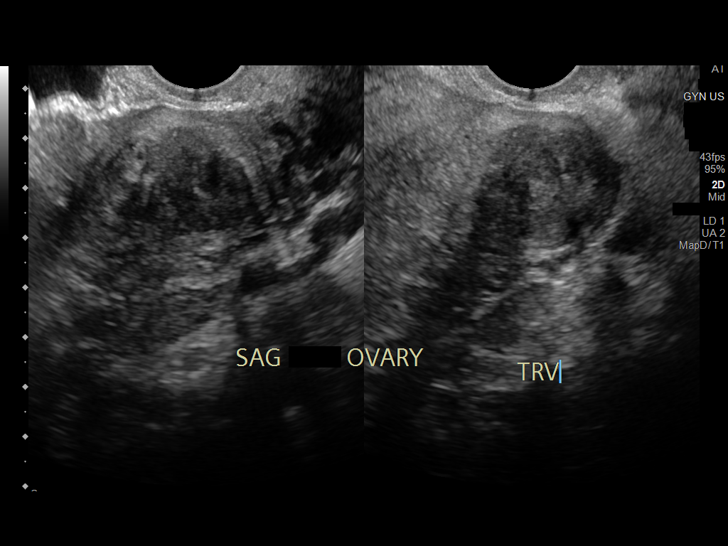
[im 36/44]
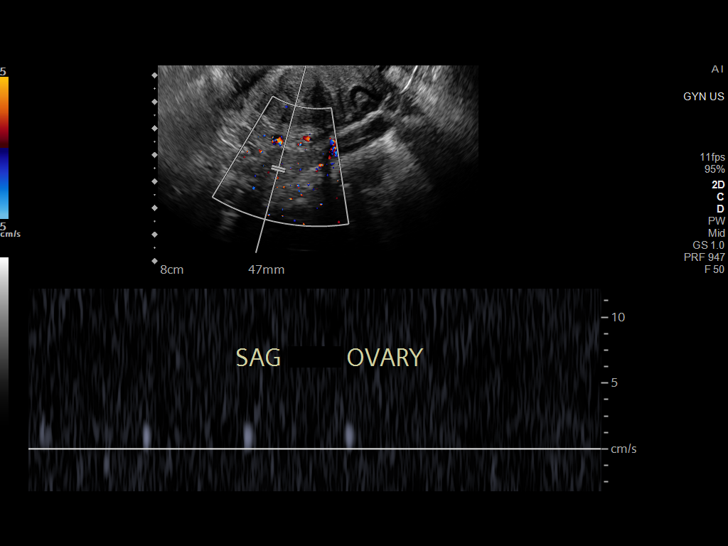
[im 40/44]
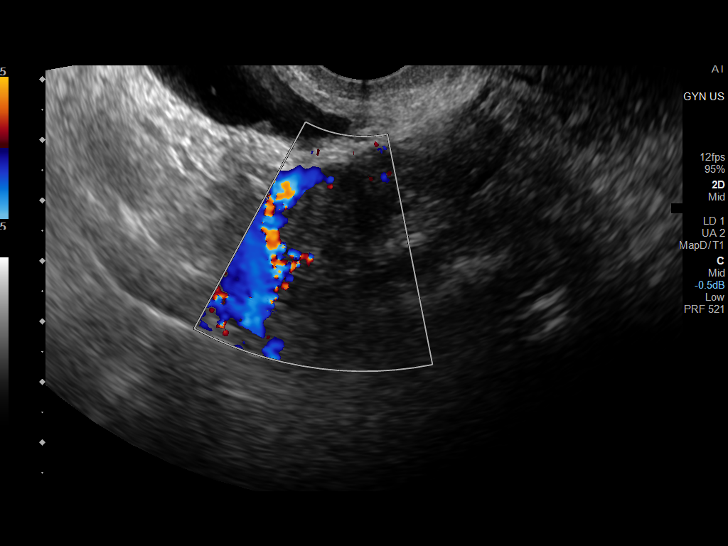
[im 44/44]
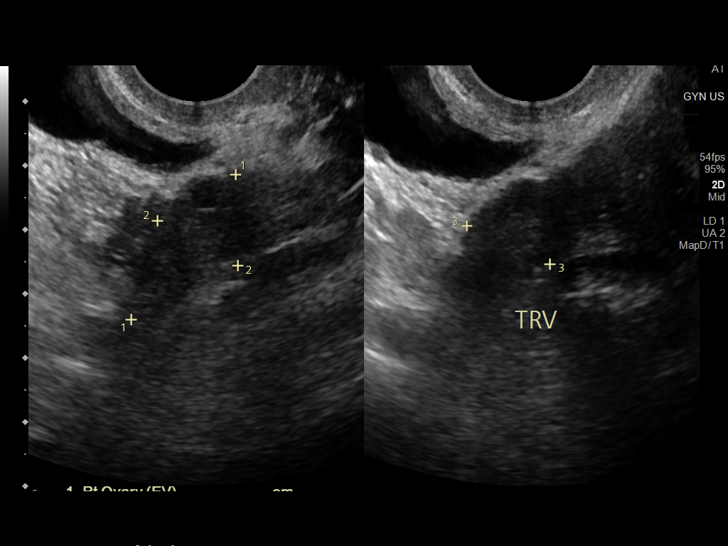

[13 of 25 positions shown; findings below may reference images not displayed]

FINDINGS: Uterus

Measurements: 10.9 x 6.3 x 7.4 cm = volume: 267.5 mL. Uterine
fibroids. Fundal fibroid measures 2.9 x 3.4 by 3.2 cm. Left
subserosal uterine corpus fibroid measures 3.5 x 2.6 x 3.2 cm.

Endometrium

Thickness: 7.1 mm.  No focal abnormality visualized.

Right ovary

Measurements: 2.8 x 1.4 x 1.4 cm = volume: 3.0 mL. Normal
appearance/no adnexal mass.

Left ovary

Measurements: 3.2 x 1 x 0.8 cm = volume: 1.3 mL. Normal
appearance/no adnexal mass.

Pulsed Doppler evaluation of both ovaries demonstrates normal
low-resistance arterial and venous waveforms. Limited waveforms for
left ovary, likely due to posterior position and habitus.

Other findings

No abnormal free fluid.
IMPRESSION: 1. Slightly enlarged uterus with fibroids.
2. Normal right ovary
3. Doppler evaluation of left ovary limited secondary to posterior
position and habitus

## 2023-06-06 ENCOUNTER — Encounter (HOSPITAL_COMMUNITY): Payer: Self-pay | Admitting: *Deleted

## 2023-06-06 ENCOUNTER — Other Ambulatory Visit: Payer: Self-pay

## 2023-06-06 ENCOUNTER — Ambulatory Visit (HOSPITAL_COMMUNITY)
Admission: EM | Admit: 2023-06-06 | Discharge: 2023-06-06 | Disposition: A | Discharge status: Home / Self Care | Attending: Emergency Medicine | Admitting: Emergency Medicine

## 2023-06-06 DIAGNOSIS — N898 Other specified noninflammatory disorders of vagina: Secondary | ICD-10-CM | POA: Diagnosis not present

## 2023-06-06 DIAGNOSIS — I10 Essential (primary) hypertension: Secondary | ICD-10-CM | POA: Diagnosis not present

## 2023-06-06 MED ORDER — MICONAZOLE NITRATE 2 % EX CREA: 1.0000 | TOPICAL_CREAM | Freq: Two times a day (BID) | CUTANEOUS | 0 refills | Status: DC

## 2023-06-06 MED ORDER — LOSARTAN POTASSIUM 100 MG PO TABS: 100.0000 mg | ORAL_TABLET | Freq: Every day | ORAL | 0 refills | Status: DC

## 2023-06-06 MED ORDER — AMLODIPINE BESYLATE 10 MG PO TABS: 10.0000 mg | ORAL_TABLET | Freq: Every day | ORAL | 0 refills | Status: DC
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
## 2023-06-06 NOTE — ED Triage Notes (Addendum)
 PT reports external labia itching for 2 weeks . Pt does not have vag discharge. Pt reports irritation like a rash . PT has a PCP appt. 06-12-23 but has not had meds for over 2 months.

## 2023-06-06 NOTE — Discharge Instructions (Signed)
 Apply miconazole cream twice daily for possible yeast coverage that can be causing her vaginal itching. You you can continue to apply the anti-itch cream that you showed me in clinic today, I would just avoid applying this internally. I have refilled your blood pressure medications as requested for 7 days. Keep your appointment with your primary care provider on 4/3 where they can further manage your blood pressure and other chronic illnesses.  If you still have vaginal itching at that time I would mention this to them as they can provide further management of this. Return here as needed.

## 2023-06-12 ENCOUNTER — Encounter: Payer: Self-pay | Admitting: Family Medicine

## 2023-06-12 ENCOUNTER — Ambulatory Visit: Admitting: Family Medicine

## 2023-06-12 VITALS — BP 165/90 | HR 96 | Ht 66.0 in | Wt 186.2 lb

## 2023-06-12 DIAGNOSIS — I10 Essential (primary) hypertension: Secondary | ICD-10-CM | POA: Diagnosis not present

## 2023-06-12 DIAGNOSIS — E119 Type 2 diabetes mellitus without complications: Secondary | ICD-10-CM

## 2023-06-12 DIAGNOSIS — D508 Other iron deficiency anemias: Secondary | ICD-10-CM | POA: Diagnosis not present

## 2023-06-12 DIAGNOSIS — N898 Other specified noninflammatory disorders of vagina: Secondary | ICD-10-CM

## 2023-06-12 LAB — POCT WET PREP (WET MOUNT)
Clue Cells Wet Prep Whiff POC: POSITIVE
Trichomonas Wet Prep HPF POC: ABSENT

## 2023-06-12 MED ORDER — METFORMIN HCL ER 500 MG PO TB24: 1000.0000 mg | ORAL_TABLET | Freq: Every day | ORAL | 1 refills | Status: DC

## 2023-06-12 MED ORDER — AMLODIPINE BESYLATE 10 MG PO TABS: 10.0000 mg | ORAL_TABLET | Freq: Every day | ORAL | 1 refills | Status: DC

## 2023-06-12 MED ORDER — ATORVASTATIN CALCIUM 40 MG PO TABS: 40.0000 mg | ORAL_TABLET | Freq: Every day | ORAL | 1 refills | Status: DC

## 2023-06-12 MED ORDER — FERROUS GLUCONATE 324 (38 FE) MG PO TABS
324.0000 mg | ORAL_TABLET | Freq: Every day | ORAL | 3 refills | Status: DC
Start: 2023-06-12 — End: 2023-09-19

## 2023-06-12 MED ORDER — METRONIDAZOLE 500 MG PO TABS: 500.0000 mg | ORAL_TABLET | Freq: Two times a day (BID) | ORAL | 0 refills | Status: AC

## 2023-06-12 MED ORDER — LOSARTAN POTASSIUM 100 MG PO TABS: 100.0000 mg | ORAL_TABLET | Freq: Every day | ORAL | 1 refills | Status: DC

## 2023-06-12 NOTE — Patient Instructions (Signed)
 Dear Jessica Herman  Today we discussed the following concerns and plans:  Refill medications - I have resent in medicines as we discussed; if you have any trouble getting these medicines please let me know.  Vaginal itching - I will let you know when we get the results and if any prescriptions need to be sent in.  If you have any concerns, please call the clinic or schedule an appointment.  It was a pleasure to take care of you today. Be well!  Cyndia Skeeters, DO [facility], PGY-1

## 2023-06-12 NOTE — Progress Notes (Signed)
    SUBJECTIVE:   CHIEF COMPLAINT / HPI:   Insurance issues/needs to restart medications Patient lost her insurance coverage several months ago when her job was dissolved.  She has been out of all of her medicines for the last 2 months.  She now has new insurance and needs to restart her medications.  Vaginal itching This has been ongoing for the last several weeks.  She describes "dry, itchy" feeling around the vulva and inside the vagina.  No discharge.  No odor.  No new sexual partners.  She did present to urgent care recently for this; they did not swab her but did prescribe miconazole cream, which she reports has helped some.  PERTINENT  PMH / PSH: Reviewed. HTN T2DM Anemia, heavy menses   OBJECTIVE:   BP (!) 165/90   Pulse 96   Ht 5\' 6"  (1.676 m)   Wt 186 lb 3.2 oz (84.5 kg)   LMP 06/06/2023   SpO2 100%   BMI 30.05 kg/m   General: well-appearing, no acute distress. HEENT: normocephalic, PERRLA, EOM grossly intact. Cardio: Regular rate, reg rhythm, no murmurs on exam. Pulm: Clear, no wheezing, no crackles. No increased work of breathing. GU Exam:  External exam: Normal-appearing female external genitalia.   Vaginal exam notable for normal ruggae.  Cervix without discharge or obvious lesion. Chaperoned exam, CMA Vance Gather.    ASSESSMENT/PLAN:   Assessment & Plan Diabetes mellitus without complication (HCC) Patient is due for A1c, however anticipate that this will be uncontrolled and inaccurate given her lack of medications in the last 2 months. - Refilled metformin 1000 mg daily - Return for A1c check in 3 months Hypertension, unspecified type Pressure uncontrolled today.  She was recently in the ED with very elevated pressures; they gave her a very short supply of losartan and amlodipine to get her to today's visit. - Refilled amlodipine 10 mg daily, losartan 100 mg daily - Return for blood pressure recheck in 2 weeks Iron deficiency anemia secondary to inadequate  dietary iron intake She typically takes ferrous gluconate 324 mg daily.  Has been out of this as well.  Has not had heavy menses lately, no severe fatigue or other concerning symptoms of anemia. - Refilled today. -Consider CBC at follow-up if patient becomes symptomatic Vaginal itching Ongoing for several weeks.  -Obtain swab today; returned with evidence of BV. - Will send in Rx for Flagyl 500 mg twice daily for 7 days.     Cyndia Skeeters, DO [facility]

## 2023-06-12 NOTE — Assessment & Plan Note (Signed)
 She typically takes ferrous gluconate 324 mg daily.  Has been out of this as well.  Has not had heavy menses lately, no severe fatigue or other concerning symptoms of anemia. - Refilled today. -Consider CBC at follow-up if patient becomes symptomatic

## 2023-06-12 NOTE — Assessment & Plan Note (Signed)
 Pressure uncontrolled today.  She was recently in the ED with very elevated pressures; they gave her a very short supply of losartan and amlodipine to get her to today's visit. - Refilled amlodipine 10 mg daily, losartan 100 mg daily - Return for blood pressure recheck in 2 weeks

## 2023-06-12 NOTE — Assessment & Plan Note (Signed)
 Patient is due for A1c, however anticipate that this will be uncontrolled and inaccurate given her lack of medications in the last 2 months. - Refilled metformin 1000 mg daily - Return for A1c check in 3 months

## 2023-07-04 ENCOUNTER — Encounter: Payer: Self-pay | Admitting: Family Medicine

## 2023-07-04 ENCOUNTER — Ambulatory Visit: Admitting: Family Medicine

## 2023-07-04 VITALS — BP 120/60 | HR 83 | Ht 66.0 in | Wt 191.2 lb

## 2023-07-04 DIAGNOSIS — I10 Essential (primary) hypertension: Secondary | ICD-10-CM | POA: Diagnosis not present

## 2023-07-04 DIAGNOSIS — K5909 Other constipation: Secondary | ICD-10-CM | POA: Diagnosis not present

## 2023-07-04 DIAGNOSIS — G479 Sleep disorder, unspecified: Secondary | ICD-10-CM | POA: Diagnosis not present

## 2023-07-04 NOTE — Patient Instructions (Signed)
 Dear Jessica Herman  Today we discussed the following concerns and plans:  Blood pressure - Your blood pressure looks great!  Continue taking your amlodipine  and losartan  daily.  Constipation - Take fiber daily - Take 1 capful of MiraLAX twice a day (morning and evening) for 7 days - Drink at least 80 ounces of water daily (7 to 8 glasses) - If not improved in 1 week please let me know  Sleep - Practice good sleep hygiene habits including going to bed at the same time every night, avoiding screens prior to going to bed, etc. - If this does not improve please let me know so we can explore other options  If you have any concerns, please call the clinic or schedule an appointment.  It was a pleasure to take care of you today. Be well!  Omar Bibber, DO [facility], PGY-1

## 2023-07-04 NOTE — Progress Notes (Addendum)
    SUBJECTIVE:   CHIEF COMPLAINT / HPI:   High blood pressure Patient has been taking amlodipine  10 mg daily, losartan  100 mg daily without any issues or missed doses. She has not been checking her blood pressures at home.  No headaches, dizziness/lightheadedness, vision changes.  Constipation 2 weeks, bloated, no belly pain.  Stool is hard and she has to strain to have a bowel movement.  She has been keeping well-hydrated. She has tried fiber a few times, Miralax twice, without much benefit.  Trouble sleeping Trouble falling asleep, she lies in bed for several hours before she is able to settle down.  When she falls asleep she is able to stay asleep.  Has a habit of sleeping with the TV on, so she has tried to reduce this recently.  PERTINENT  PMH / PSH:  HTN T2DM Anemia, heavy menses  OBJECTIVE:   BP 120/60   Pulse 83   Ht 5\' 6"  (1.676 m)   Wt 191 lb 4 oz (86.8 kg)   LMP 06/06/2023   SpO2 99%   BMI 30.87 kg/m    General: Well-appearing, very pleasant, no acute distress. HEENT: normocephalic, MMM Cardio: Regular rate, regular rhythm, no murmurs on exam. Pulm: Clear, no wheezing, no crackles. No increased work of breathing. Abdominal: bowel sounds present, soft, non-tender, non-distended. Extremities: no peripheral edema. Moves all extremities equally.   ASSESSMENT/PLAN:   Assessment & Plan Hypertension, unspecified type Well-controlled, 120/60 today.  No medication changes indicated. - Continue amlodipine  10 mg daily, losartan  100 mg daily Other constipation Acute. - Fiber daily - MiraLAX twice daily x 7 days - Referral placed for colonoscopy - If this does not improve after a week patient will let me know so we can consider other options; consider pelvic exam and abdominal ultrasound on follow-up Trouble in sleeping Difficulty initiating sleep.  Discussed the importance of good sleep hygiene and what this looks like. - Patient will initiate good sleep  hygiene practices - If her sleep is not improving in several weeks she will return to clinic to explore other options     Omar Bibber, DO [facility]

## 2023-07-04 NOTE — Assessment & Plan Note (Signed)
 Well-controlled, 120/60 today.  No medication changes indicated. - Continue amlodipine  10 mg daily, losartan  100 mg daily

## 2023-07-06 ENCOUNTER — Other Ambulatory Visit: Payer: Self-pay | Admitting: Family Medicine

## 2023-07-06 DIAGNOSIS — K5909 Other constipation: Secondary | ICD-10-CM

## 2023-07-07 ENCOUNTER — Telehealth: Payer: Self-pay

## 2023-07-07 NOTE — Telephone Encounter (Signed)
 Called patient to inform her about Dr. Bernardino Bridge sending a referral to GI for a colonoscopy due to her age.   Also was able to schedule patient an appointment to see Dr. Bernardino Bridge to discuss constipation per Dr. Bernardino Bridge request.  Patient was unable to do a 2 week appointment but was able to schedule her 08/01/2023 at 3:15pm.  Patient was agreeable with the appointment date and time.  Christ Courier, CMA

## 2023-07-07 NOTE — Telephone Encounter (Signed)
-----   Message from Omar Bibber sent at 07/06/2023 11:56 AM EDT ----- Regarding: Please call patient Please call pt and let her know I have sent in referral to GI for colonoscopy as she is due based on age (>75).  I would like her to follow up with me in clinic in 2 weeks to discuss her constipation - please make appointment if pt is agreeable.  Thanks!! Isa Manuel

## 2023-07-31 ENCOUNTER — Encounter: Payer: Self-pay | Admitting: Physician Assistant

## 2023-08-01 ENCOUNTER — Ambulatory Visit: Admitting: Family Medicine

## 2023-08-16 ENCOUNTER — Encounter

## 2023-08-27 ENCOUNTER — Other Ambulatory Visit: Payer: Self-pay

## 2023-08-27 ENCOUNTER — Emergency Department (HOSPITAL_COMMUNITY)
Admission: EM | Admit: 2023-08-27 | Discharge: 2023-08-28 | Disposition: A | Discharge status: Home / Self Care | Attending: Emergency Medicine | Admitting: Emergency Medicine

## 2023-08-27 ENCOUNTER — Encounter (HOSPITAL_COMMUNITY): Payer: Self-pay

## 2023-08-27 DIAGNOSIS — Z79899 Other long term (current) drug therapy: Secondary | ICD-10-CM | POA: Diagnosis not present

## 2023-08-27 DIAGNOSIS — I10 Essential (primary) hypertension: Secondary | ICD-10-CM | POA: Diagnosis not present

## 2023-08-27 DIAGNOSIS — M546 Pain in thoracic spine: Secondary | ICD-10-CM | POA: Diagnosis not present

## 2023-08-27 DIAGNOSIS — R202 Paresthesia of skin: Secondary | ICD-10-CM | POA: Insufficient documentation

## 2023-08-27 DIAGNOSIS — M549 Dorsalgia, unspecified: Secondary | ICD-10-CM | POA: Diagnosis present

## 2023-08-27 DIAGNOSIS — E119 Type 2 diabetes mellitus without complications: Secondary | ICD-10-CM | POA: Diagnosis not present

## 2023-08-27 DIAGNOSIS — R200 Anesthesia of skin: Secondary | ICD-10-CM

## 2023-08-27 DIAGNOSIS — R030 Elevated blood-pressure reading, without diagnosis of hypertension: Secondary | ICD-10-CM

## 2023-08-27 DIAGNOSIS — Z7984 Long term (current) use of oral hypoglycemic drugs: Secondary | ICD-10-CM | POA: Insufficient documentation

## 2023-08-27 NOTE — ED Triage Notes (Addendum)
 Pt reports right upper back pain onset three days ago that radiates down into her right arm causing tingling to her fingers. She reports the pain is worse when she moves her arm.

## 2023-08-28 MED ORDER — METHOCARBAMOL 500 MG PO TABS: 500.0000 mg | ORAL_TABLET | Freq: Two times a day (BID) | ORAL | 0 refills | Status: DC
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
## 2023-08-28 NOTE — Discharge Instructions (Addendum)
 You were evaluated in the emergency room for back pain and right arm pain and numbness.  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  A prescription for Robaxin , muscle relaxer was sent into your pharmacy.  You may also alternate Tylenol and ibuprofen .  Please call the number on the sheet to follow-up with orthopedics.  Your blood pressure was noted to be elevated today.  Please follow-up with your primary care doctor regarding this.

## 2023-09-01 ENCOUNTER — Other Ambulatory Visit: Payer: Self-pay

## 2023-09-01 ENCOUNTER — Ambulatory Visit (HOSPITAL_COMMUNITY)
Admission: RE | Admit: 2023-09-01 | Discharge: 2023-09-01 | Disposition: A | Discharge status: Home / Self Care | Source: Ambulatory Visit | Attending: Nurse Practitioner | Admitting: Nurse Practitioner

## 2023-09-01 ENCOUNTER — Encounter (HOSPITAL_COMMUNITY): Payer: Self-pay

## 2023-09-01 VITALS — BP 181/97 | HR 92 | Temp 98.6°F | Resp 20

## 2023-09-01 DIAGNOSIS — M898X1 Other specified disorders of bone, shoulder: Secondary | ICD-10-CM

## 2023-09-01 DIAGNOSIS — G5621 Lesion of ulnar nerve, right upper limb: Secondary | ICD-10-CM

## 2023-09-01 MED ORDER — METHYLPREDNISOLONE ACETATE 40 MG/ML IJ SUSP
40.0000 mg | Freq: Once | INTRAMUSCULAR | Status: AC
  Administered 2023-09-01: 40 mg via INTRAMUSCULAR

## 2023-09-01 MED ORDER — METHYLPREDNISOLONE 4 MG PO TBPK: ORAL_TABLET | ORAL | 0 refills | Status: DC

## 2023-09-01 MED ORDER — METHYLPREDNISOLONE ACETATE 40 MG/ML IJ SUSP
INTRAMUSCULAR | Status: AC
  Filled 2023-09-01: qty 1

## 2023-09-01 NOTE — Discharge Instructions (Addendum)
 We have given you an injection of steroid medication today to help with pain and inflammation along your right ulnar nerve.  Start taking the Medrol dosepack and take as prescribed starting tomorrow morning.  Follow up with Ortho or Sports Medicine if symptoms do not improve with treatment.
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
## 2023-09-01 NOTE — ED Triage Notes (Signed)
 Complains of right upper back pain that radiates down right arm.  Patient was seen in ED and provided medication, but reports nothing is helping

## 2023-09-08 ENCOUNTER — Other Ambulatory Visit: Payer: Self-pay | Admitting: Family Medicine

## 2023-09-08 ENCOUNTER — Encounter: Payer: Self-pay | Admitting: Family Medicine

## 2023-09-08 ENCOUNTER — Ambulatory Visit (INDEPENDENT_AMBULATORY_CARE_PROVIDER_SITE_OTHER): Admitting: Family Medicine

## 2023-09-08 VITALS — BP 166/92 | HR 99 | Ht 66.0 in | Wt 180.8 lb

## 2023-09-08 DIAGNOSIS — E119 Type 2 diabetes mellitus without complications: Secondary | ICD-10-CM | POA: Diagnosis not present

## 2023-09-08 DIAGNOSIS — D509 Iron deficiency anemia, unspecified: Secondary | ICD-10-CM

## 2023-09-08 DIAGNOSIS — M25511 Pain in right shoulder: Secondary | ICD-10-CM

## 2023-09-08 DIAGNOSIS — D508 Other iron deficiency anemias: Secondary | ICD-10-CM

## 2023-09-08 DIAGNOSIS — K5900 Constipation, unspecified: Secondary | ICD-10-CM

## 2023-09-08 DIAGNOSIS — M546 Pain in thoracic spine: Secondary | ICD-10-CM

## 2023-09-08 DIAGNOSIS — Z111 Encounter for screening for respiratory tuberculosis: Secondary | ICD-10-CM

## 2023-09-08 MED ORDER — ATORVASTATIN CALCIUM 40 MG PO TABS: 40.0000 mg | ORAL_TABLET | Freq: Every day | ORAL | 3 refills | Status: DC

## 2023-09-08 MED ORDER — DICLOFENAC SODIUM 1 % EX GEL: 2.0000 g | Freq: Four times a day (QID) | CUTANEOUS | 1 refills | Status: AC | PRN

## 2023-09-08 NOTE — Assessment & Plan Note (Signed)
 We will check CBC today to follow-up on prior anemia, as below.

## 2023-09-08 NOTE — Patient Instructions (Signed)
 It was so good to see you today! Thank you for allowing me to take care of you.  Today we discussed the following concerns and plans:  Back/shoulder pain - I have referred you to orthopedics; they should call you to schedule an appointment. - In the meantime you may use Voltaren gel to your shoulder.  You may also use Tylenol 650 mg every 8 hours as needed.  Constipation - The iron supplement could be worsening this; please hold this for now. - Keep your appointment with GI for your colonoscopy  Diabetes - We got labs today, I will let you know the results. - I placed a referral to ophthalmology; they should call you to schedule your diabetic eye exam.  If you have any concerns, please call the clinic or schedule an appointment.  It was a pleasure to take care of you today. Be well!  Lauraine Norse, DO [facility], PGY-1   Don't forget to check out the [facility] in [facility] at [address] [phone] Affordable prices on prescriptions and over-the-counter items, as well as services like vaccinations and medication home delivery.

## 2023-09-08 NOTE — Assessment & Plan Note (Signed)
 Previously well-controlled, awaiting A1c today - Will also get urine creatinine and BMP - Will need foot exam, but deferred to next visit - Also needs diabetic eye exam; encouraged patient to schedule this.  Referral placed.

## 2023-09-08 NOTE — Progress Notes (Signed)
    SUBJECTIVE:   CHIEF COMPLAINT / HPI:   Back pain Recently seen in the urgent care on 09/01/2023 and was suspected to have entrapment of right ulnar nerve.  She was given steroids which did not help. Also given muscle relaxer which did not help.  No falls or other injuries. 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 She is right handed. Pain radiates to right shoulder, upper thoracic region on the right. Numbness to 4th and 5th finger of right hand.  T2DM Taking metformin  1000 mg daily. She does not check her sugars at home. Due for labs and foot exam today. Has not had Ophthalmology/diabetic eye exam recently - needs a referral.  Has appointment scheduled with GI 09/19/2023 to discuss colonoscopy. She is still having trouble with constipation.  PERTINENT  PMH / PSH: Reviewed.  OBJECTIVE:   BP (!) 166/92   Pulse 99   Ht 5' 6 (1.676 m)   Wt 180 lb 12.8 oz (82 kg)   LMP 08/06/2023 (Approximate)   SpO2 98%   BMI 29.18 kg/m    General: Well-appearing, no acute distress. HEENT: normocephalic, PERRLA, EOM grossly intact. Cardio: Regular rate, regular rhythm, no murmurs on exam. Pulm: Clear, no wheezing, no crackles. No increased work of breathing. Abdominal: bowel sounds present, soft, non-tender, non-distended. Extremities: no peripheral edema. Moves all extremities equally. Neuro: Alert and oriented x3, speech normal in content, no facial asymmetry. Psych: Somewhat tearful due to discomfort.  Cognition and judgment appear intact. Alert, communicative, and cooperative.   ASSESSMENT/PLAN:   Assessment & Plan Right-sided thoracic back pain, unspecified chronicity The nature of patient's back pain is more consistent with musculoskeletal versus nerve entrapment. No lumbar symptoms or saddle anesthesia.  - referral to orthopedics for further management -Recommend use of lidocaine patch or Voltaren gel -Rx Tylenol 650 mg every 8 hours as  needed. Diabetes mellitus without complication (HCC) Previously well-controlled, awaiting A1c today - Will also get urine creatinine and BMP - Will need foot exam, but deferred to next visit - Also needs diabetic eye exam; encouraged patient to schedule this.  Referral placed. Iron deficiency anemia, unspecified iron deficiency anemia type We will check CBC today to follow-up on prior anemia, as below. Constipation, unspecified constipation type May be related to patient's daily iron supplement for prior IDA. Given her age and history of anemia, GI malignancy is a concern. -Will check CBC with blood work today -Patient will hold iron supplements for now - she will keep her appt with GI for colonoscopy Screening-pulmonary TB Needed for work - Quantiferon gold with labs today     Lauraine Norse, DO [facility]

## 2023-09-08 NOTE — Addendum Note (Signed)
 Addended by: Maleke Feria L on: 09/08/2023 05:08 PM   Modules accepted: Orders

## 2023-09-09 LAB — MICROALBUMIN / CREATININE URINE RATIO
Creatinine, Urine: 60.3 mg/dL
Microalb/Creat Ratio: 299 mg/g{creat} — ABNORMAL HIGH (ref 0–29)
Microalbumin, Urine: 180 ug/mL

## 2023-09-10 ENCOUNTER — Ambulatory Visit: Payer: Self-pay | Admitting: Family Medicine

## 2023-09-11 ENCOUNTER — Ambulatory Visit (INDEPENDENT_AMBULATORY_CARE_PROVIDER_SITE_OTHER): Admitting: Pharmacist

## 2023-09-11 ENCOUNTER — Encounter: Payer: Self-pay | Admitting: Pharmacist

## 2023-09-11 VITALS — BP 153/88 | HR 83 | Wt 180.0 lb

## 2023-09-11 DIAGNOSIS — E119 Type 2 diabetes mellitus without complications: Secondary | ICD-10-CM

## 2023-09-11 DIAGNOSIS — E1169 Type 2 diabetes mellitus with other specified complication: Secondary | ICD-10-CM | POA: Insufficient documentation

## 2023-09-11 DIAGNOSIS — I10 Essential (primary) hypertension: Secondary | ICD-10-CM

## 2023-09-11 LAB — CBC WITH DIFFERENTIAL/PLATELET
Basophils Absolute: 0 10*3/uL (ref 0.0–0.2)
Basos: 0 %
EOS (ABSOLUTE): 0 10*3/uL (ref 0.0–0.4)
Eos: 0 %
Hematocrit: 38.5 % (ref 34.0–46.6)
Hemoglobin: 12.3 g/dL (ref 11.1–15.9)
Immature Grans (Abs): 0.1 10*3/uL (ref 0.0–0.1)
Immature Granulocytes: 1 %
Lymphocytes Absolute: 1.8 10*3/uL (ref 0.7–3.1)
Lymphs: 15 %
MCH: 28.9 pg (ref 26.6–33.0)
MCHC: 31.9 g/dL (ref 31.5–35.7)
MCV: 91 fL (ref 79–97)
Monocytes Absolute: 1.9 10*3/uL — ABNORMAL HIGH (ref 0.1–0.9)
Monocytes: 15 %
Neutrophils Absolute: 8.6 10*3/uL — ABNORMAL HIGH (ref 1.4–7.0)
Neutrophils: 69 %
Platelets: 354 10*3/uL (ref 150–450)
RBC: 4.25 x10E6/uL (ref 3.77–5.28)
RDW: 12.4 % (ref 11.7–15.4)
WBC: 12.5 10*3/uL — ABNORMAL HIGH (ref 3.4–10.8)

## 2023-09-11 LAB — QUANTIFERON-TB GOLD PLUS
QuantiFERON Mitogen Value: >10 [IU]/mL
QuantiFERON Nil Value: 0.14 [IU]/mL
QuantiFERON TB1 Ag Value: 0.15 [IU]/mL
QuantiFERON TB2 Ag Value: 0.13 [IU]/mL

## 2023-09-11 LAB — BASIC METABOLIC PANEL WITH GFR
BUN/Creatinine Ratio: 18 (ref 9–23)
BUN: 19 mg/dL (ref 6–24)
CO2: 19 mmol/L — ABNORMAL LOW (ref 20–29)
Calcium: 10.1 mg/dL (ref 8.7–10.2)
Chloride: 91 mmol/L — ABNORMAL LOW (ref 96–106)
Creatinine, Ser: 1.06 mg/dL — ABNORMAL HIGH (ref 0.57–1.00)
Glucose: 392 mg/dL — ABNORMAL HIGH (ref 70–99)
Potassium: 4.5 mmol/L (ref 3.5–5.2)
Sodium: 131 mmol/L — ABNORMAL LOW (ref 134–144)
eGFR: 65 mL/min/{1.73_m2} (ref >59)

## 2023-09-11 LAB — HEMOGLOBIN A1C
Est. average glucose Bld gHb Est-mCnc: 349 mg/dL
Hgb A1c MFr Bld: 13.8 % — ABNORMAL HIGH (ref 4.8–5.6)

## 2023-09-11 MED ORDER — FREESTYLE LIBRE 3 PLUS SENSOR MISC: Status: DC

## 2023-09-11 MED ORDER — LANTUS SOLOSTAR 100 UNIT/ML ~~LOC~~ SOPN: 10.0000 [IU] | PEN_INJECTOR | Freq: Every day | SUBCUTANEOUS | 99 refills | Status: AC

## 2023-09-11 MED ORDER — INSULIN PEN NEEDLE 32G X 4 MM MISC
1.0000 | 99 refills | Status: AC | PRN
Start: 2023-09-11 — End: ?

## 2023-09-11 NOTE — Progress Notes (Signed)
 Reviewed and agree with Dr Macky Lower plan.

## 2023-09-11 NOTE — Assessment & Plan Note (Signed)
 ASCVD risk - primary prevention in patient with diabetes. Last LDL is 89 not at goal of <70  mg/dL. ASCVD risk factors include hypertension and Diabetes.  high intensity statin indicated.  -Continued atorvastatin  40 mg

## 2023-09-11 NOTE — Assessment & Plan Note (Signed)
 Hypertension longstanding and currently elevated higher than goal likely related to pain (5/10 today). . Blood pressure goal of <130/80 mmHg. Medication adherence good. Blood pressure control is suboptimal due to pain and stress of poor glucose control. -Continue amlodipine  10mg , and losartan  100mg  Reassess at next visit UACR was 299 at check last visit - consider repeat and SGLT2 in future.

## 2023-09-11 NOTE — Progress Notes (Signed)
 S:     Chief Complaint  Patient presents with   Medication Management   48 y.o. female who presents for diabetes evaluation, education, and management in the context of the LIBERATE Study.  Today, patient arrives in good spirits and presents without any assistance. Patient reports ongoing hip and back pain which has recently been treated with both steroid dose pack AND steroid injection.   Patient was referred and last seen by Primary Care Provider, Dr. Lafe, on 09/08/2023.  At last visit, patient was found to have A1C of 13.8 and was referred for diabetes management.   PMH is significant for hypertension,  Hyperlipidemia and back pain.   Patient reports Diabetes was diagnosed 08/2020.   Current diabetes medications include: Metformin  1000mg  XR once daily (taking 2 X 500mg ) Current hypertension medications include: amlodipine  10mg , losartan  100mg  Current hyperlipidemia medications include: atorvastatin  40mg    Patient reports adherence to taking all medications as prescribed.   Patient denies hypoglycemic events.  Patient reports nocturia (nighttime urination) 2x per night Patient reports neuropathy (nerve pain).  Right arm,  Patient denies visual changes. Patient reports self foot exams.   Patient reported dietary habits: Eats 1-2 meals/day Breakfast: minimal - yogurt / banana Lunch: salad or chicken nugget, fries sweet  Dinner: baked chicken, mac and cheese, collard greens, rice Snacks: minimal sour cream and onion chips Drinks: water, sweet tea, GREAN tea  Patient-reported exercise habits: Gym -  3 nights per week for 1 hour, treadmill, stair climber, leg press.   O:   Review of Systems  Constitutional:  Positive for malaise/fatigue.  Musculoskeletal:  Positive for back pain and joint pain (right hip).  Neurological:  Positive for tingling (right arm) and sensory change.   Physical Exam Vitals reviewed.  Constitutional:      Appearance: Normal appearance.   Pulmonary:     Effort: Pulmonary effort is normal.  Neurological:     Mental Status: She is alert.  Psychiatric:        Mood and Affect: Mood normal.        Behavior: Behavior normal.        Thought Content: Thought content normal.        Judgment: Judgment normal.    Lab Results  Component Value Date   HGBA1C 13.8 (H) 09/08/2023    Vitals:   09/11/23 0943 09/11/23 0945  BP: (!) 156/88 (!) 153/88  Pulse: 83   SpO2: 100%     Lipid Panel     Component Value Date/Time   CHOL 145 05/16/2022 1441   TRIG 78 05/16/2022 1441   HDL 41 05/16/2022 1441   CHOLHDL 3.5 05/16/2022 1441   CHOLHDL 2.7 Ratio 04/28/2008 2141   VLDL 10 04/28/2008 2141   LDLCALC 89 05/16/2022 1441   LDLDIRECT 76 09/25/2020 0840    Clinical Atherosclerotic Cardiovascular Disease (ASCVD): No  The 10-year ASCVD risk score (Arnett DK, et al., 2019) is: 16.5%   Values used to calculate the score:     Age: 81 years     Clincally relevant sex: Female     Is Non-Hispanic African American: Yes     Diabetic: Yes     Tobacco smoker: No     Systolic Blood Pressure: 153 mmHg     Is BP treated: Yes     HDL Cholesterol: 41 mg/dL     Total Cholesterol: 145 mg/dL   Patient is participating in a Managed Medicaid Plan:  Yes   A/P:  LIBERATE Study:  -  Patient provided verbal consent to participate in the study. Consent documented in electronic medical record.  -Provided education on Libre 3 CGM. Collaborated to ensure Herlene 3 app was downloaded on patient's phone. Educated on how to place sensor every 14 days, patient placed first sensor correctly and verbalized understanding of use, removal, and how to place next sensor. Discussed alarms. 8 sensors provided for a 3 month supply. Educated to contact the office if the sensor falls off early and replacements are needed before their next [employer].    Diabetes longstanding diagnosed in 08/2020 and history of gestation diabetes with last of 3 children. Patient  is able to verbalize appropriate hypoglycemia management plan. Medication adherence appears good. Control is suboptimal due to pain, recent steroid therapy and dietary indiscretion. -Started basal insulin Lantus (insulin glargine) at 10 units daily. Patient will continue to titrate 1 unit every day if fasting blood sugar > 150mg /dl until fasting blood sugars reach goal or next visit.  -Continued metformin  XR 1000mg  once daily in the AM.   -Patient educated on purpose, proper use, and potential adverse effects of insulin.  -Extensively discussed pathophysiology of diabetes, recommended lifestyle interventions, dietary effects on blood sugar control.  -Counseled on s/sx of and management of hypoglycemia.  -Next A1c anticipated 3 month CGM visit.   ASCVD risk - primary prevention in patient with diabetes. Last LDL is 89 not at goal of <70  mg/dL. ASCVD risk factors include hypertension and Diabetes.  high intensity statin indicated.  -Continued atorvastatin  40 mg.   Hypertension longstanding and currently elevated higher than goal likely related to pain (5/10 today). . Blood pressure goal of <130/80 mmHg. Medication adherence good. Blood pressure control is suboptimal due to pain and stress of poor glucose control. -Continue amlodipine  10mg , and losartan  100mg  Reassess at next visit UACR was 299 at check last visit - consider repeat and SGLT2 in future.   Shared results of TB - Quant Gold (NEGATIVE) - printed results provided for her work.   Written patient instructions provided. Patient verbalized understanding of treatment plan.  Total time in face to face counseling 97 minutes.    Follow-up:  Pharmacist 09/25/2023. PCP clinic visit in TBD.

## 2023-09-11 NOTE — Assessment & Plan Note (Signed)
 LIBERATE Study -Patient provided verbal consent to participate in the study. 8 sensors provided for a 3 month supply.   Diabetes longstanding diagnosed in 08/2020 and history of gestation diabetes with last of 3 children. Patient is able to verbalize appropriate hypoglycemia management plan. Medication adherence appears good. Control is suboptimal due to pain, recent steroid therapy and dietary indiscretion. -Started basal insulin Lantus (insulin glargine) at 10 units daily. Patient will continue to titrate 1 unit every day if fasting blood sugar > 150mg /dl until fasting blood sugars reach goal or next visit.  -Continued metformin  XR 1000mg  once daily in the AM.   -Patient educated on purpose, proper use, and potential adverse effects of insulin.  -Extensively discussed pathophysiology of diabetes, recommended lifestyle interventions, dietary effects on blood sugar control.  -Counseled on s/sx of and management of hypoglycemia.  -Next A1c anticipated 3 month CGM visit.

## 2023-09-11 NOTE — Patient Instructions (Signed)
 It was nice to see you today!  I look forward to working with you.  I hope you enjoy your Libre Glucose Sensor.   Your goal blood sugar is 80-130 before eating and less than 180 after eating.  Our goal over the next few weeks is to see more of your glucose readings throughout the day in the 100-199 range  Medication Changes: - START Lantus (insulin glargine) 10 units once daily in the AM.  Increase your dose by 1 unit EACH DAY until your morning sugars are < 150  OR  you are seen again in clinic.   - Continue your Metformin  2 tablets in the AM each day.   Continue all other medication the same.   Keep up the good work with diet and exercise. Aim for a diet full of vegetables, fruit and lean meats (chicken, [place], fish). Try to limit salt intake by eating fresh or frozen vegetables (instead of canned), rinse canned vegetables prior to cooking and do not add any additional salt to meals.   At your next visit, please determine the name of your OLD glucose meter.  New strips can be sent for your glucose meter at that time.    Sensor Application If using the App, you can tap Help in the Main Menu to access an in-app tutorial on applying a Sensor. See below for instructions on how to download the app. Apply Sensors only on the back of your upper arm. If placed in other areas, the Sensor may not function properly and could give you inaccurate readings. Avoid areas with scars, moles, stretch marks, or lumps.   Select an area of skin that generally stays flat during your normal daily activities (no bending or folding). Choose a site that is at least 1 inch (2.5 cm) away from any injection sites. To prevent discomfort or skin irritation, you should select a different site other than the one most recently used. Wash application site using a plain soap, dry, and then clean with an alcohol wipe. This will help remove any oily residue that may prevent the sensor from sticking properly. Allow site to air dry  before proceeding. Note: The area MUST be clean and dry, or the Sensor may not stay on for the full wear duration specified by your Sensor insert. 4. Unscrew the cap from the Sensor Applicator and set the cap aside.  5. Place the Sensor Applicator over the prepared site and push down firmly to apply the Sensor to your body. 6. Gently pull the Sensor Applicator away from your body. The Sensor should now be attached to your skin. 7. Make sure the Sensor is secure after application. Put the cap back on the Sensor Applicator. Discard the used Engineer, agricultural according to local regulations.  What If My Sensor Falls Off or What If My Sensor Isn't Working? Call [employer] Customer Care Team at [phone] Available 7 days a week from 8AM-8PM EST, excluding holidays If you have multiple sensors fall, contact [facility] at [phone]   The App Download the FreeStyle Jackson 3 App in your phone's app store   Load the app and select get started now Create an account  Tap scan new sensor Follow the prompts on the screen. If your sensor does not sync, try moving your phone slowly around the sensor. Phone cases may affect scanning. This will be the only time you have to scan the sensor until you apply a new sensor.   There will be  a 60 minute start up period until the app will display your glucose reading   How To Share Your Readings With Us  Once in the app, go to settings -> connected apps -> LibreView -> Enter Practice ID -> [id number]

## 2023-09-19 ENCOUNTER — Ambulatory Visit (INDEPENDENT_AMBULATORY_CARE_PROVIDER_SITE_OTHER): Admitting: Physician Assistant

## 2023-09-19 ENCOUNTER — Encounter: Payer: Self-pay | Admitting: Physician Assistant

## 2023-09-19 VITALS — BP 128/80 | HR 83 | Ht 66.0 in | Wt 186.0 lb

## 2023-09-19 DIAGNOSIS — Z1211 Encounter for screening for malignant neoplasm of colon: Secondary | ICD-10-CM

## 2023-09-19 DIAGNOSIS — K5900 Constipation, unspecified: Secondary | ICD-10-CM | POA: Diagnosis not present

## 2023-09-19 MED ORDER — NA SULFATE-K SULFATE-MG SULF 17.5-3.13-1.6 GM/177ML PO SOLN: 1.0000 | Freq: Once | ORAL | 0 refills | Status: AC

## 2023-09-19 NOTE — Patient Instructions (Addendum)
 Start Miralax twice daily.  You have been scheduled for a colonoscopy. Please follow written instructions given to you at your visit today.   If you use inhalers (even only as needed), please bring them with you on the day of your procedure.  DO NOT TAKE 7 DAYS PRIOR TO TEST- Trulicity  (dulaglutide ) Ozempic, Wegovy (semaglutide) Mounjaro (tirzepatide) Bydureon Bcise (exanatide extended release)  DO NOT TAKE 1 DAY PRIOR TO YOUR TEST Rybelsus (semaglutide) Adlyxin (lixisenatide) Victoza (liraglutide) Byetta (exanatide) ___________________________________________________________________________  _______________________________________________________  If your blood pressure at your visit was 140/90 or greater, please contact your primary care physician to follow up on this.  _______________________________________________________  If you are age 82 or older, your body mass index should be between 23-30. Your Body mass index is 30.02 kg/m. If this is out of the aforementioned range listed, please consider follow up with your Primary Care Provider.  If you are age 65 or younger, your body mass index should be between 19-25. Your Body mass index is 30.02 kg/m. If this is out of the aformentioned range listed, please consider follow up with your Primary Care Provider.   ________________________________________________________  The [facility] providers would like to encourage you to use MYCHART to communicate with providers for non-urgent requests or questions.  Due to long hold times on the telephone, sending your provider a message by [facility] may be a faster and more efficient way to get a response.  Please allow 48 business hours for a response.  Please remember that this is for non-urgent requests.  _______________________________________________________   It was a pleasure to see you today!  Thank you for trusting me with your gastrointestinal care!    Delon Failing , PA-C
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
## 2023-09-21 NOTE — Progress Notes (Signed)
 ____________________________________________________________  Attending physician addendum:  Thank you for sending this case to me. I have reviewed the entire note and agree with the plan.   Amada Jupiter, MD  ____________________________________________________________

## 2023-09-25 ENCOUNTER — Ambulatory Visit: Admitting: Pharmacist

## 2023-09-25 ENCOUNTER — Encounter: Payer: Self-pay | Admitting: Pharmacist

## 2023-09-25 VITALS — BP 133/83 | HR 74 | Ht 66.0 in | Wt 185.0 lb

## 2023-09-25 DIAGNOSIS — E119 Type 2 diabetes mellitus without complications: Secondary | ICD-10-CM

## 2023-09-25 NOTE — Assessment & Plan Note (Signed)
 Diabetes longstanding diagnosed in 08/2020 and history of gestational diabetes with last of 3 children. Much improved control with morning glucose readings in the mid-lower 100s. Patient is able to verbalize appropriate hypoglycemia management plan. Medication adherence appears good.  - Continued Lantus  (insulin  glargine) at 18 units daily.  - Continued metformin  XR 1000mg  once daily in the AM - Discussed options of both GLP and SGLT2 therapy at future visit  UACR was 299 at check recent visit - Patient educated extensively on pathophysiology of diabetes, recommended lifestyle interventions, dietary effects on blood sugar control.  -Next A1c anticipated 3 month CGM visit in early October.

## 2023-09-25 NOTE — Patient Instructions (Signed)
 It was nice to see you today!  Your goal blood sugar is 80-130 before eating and less than 180 after eating.  Medication Changes: No changes today.   Continue all other medication the same.   Keep up the good work with diet and exercise. Aim for a diet full of vegetables, fruit and lean meats (chicken, [place], fish). Try to limit salt intake by eating fresh or frozen vegetables (instead of canned), rinse canned vegetables prior to cooking and do not add any additional salt to meals.

## 2023-09-25 NOTE — Progress Notes (Signed)
 S:     Chief Complaint  Patient presents with   Medication Management    Diabetes Follow-Up   48 y.o. female who arrives in good spirits and presents without any assistance.    Patient was referred and last seen by Primary Care Provider, Dr. Lafe, on 09/08/2023.  At last visit, patient was found to have A1C of 13.8 and was referred for diabetes management.    PMH is significant for hypertension, hyperlipidemia and back pain.    Patient reports diabetes was diagnosed 08/2020.    Current diabetes medications include: Metformin  1000mg  XR once daily (taking 2 X 500mg ), Lantus  (insulin  glargine) 18 units once daily Current hypertension medications include: amlodipine  10mg , losartan  100mg  Current hyperlipidemia medications include: atorvastatin  40mg     Patient reports adherence to taking all medications as prescribed.    Patient denies hypoglycemic events.   Patient reports nocturia (nighttime urination) 2x per night has decreased to 1X per night - sleep is still a problem due to ARM/Back pain. Patient reports neuropathy (nerve pain).  Right arm,  Patient visual changes  - which we discussed will take a period of time to resolve.   Patient reported dietary habits: Eats 1-2 meals/day Drinks: more water, eliminated all sugar containing teas.  Patient-reported exercise: mostly walking at work.    O:   Review of Systems  Constitutional:  Negative for malaise/fatigue.  Genitourinary:  Negative for frequency.  Musculoskeletal:  Positive for back pain, joint pain (right arm) and neck pain.  Neurological:  Positive for tingling and sensory change.  All other systems reviewed and are negative.   Physical Exam Vitals reviewed.  Constitutional:      Appearance: Normal appearance.  Pulmonary:     Effort: Pulmonary effort is normal.  Neurological:     Mental Status: She is alert.  Psychiatric:        Mood and Affect: Mood normal.        Behavior: Behavior normal.         Thought Content: Thought content normal.        Judgment: Judgment normal.    Libre3 CGM Download today 09/26/2023 % Time CGM is active: 95% Average Glucose: 205 mg/dL Glucose Management Indicator: 8.2  Glucose Variability: 38.7% (goal <36%) Time in Goal:  - Time in range 70-180: 51% - Time above range: 49% - Time below range: 0% Observed patterns: significantly better in week #2 compared to first week.  GMI likely in the mid-7 range for second week.  Lab Results  Component Value Date   HGBA1C 13.8 (H) 09/08/2023   Vitals:   09/25/23 1605  BP: 133/83  Pulse: 74  SpO2: 100%    Lipid Panel     Component Value Date/Time   CHOL 145 05/16/2022 1441   TRIG 78 05/16/2022 1441   HDL 41 05/16/2022 1441   CHOLHDL 3.5 05/16/2022 1441   CHOLHDL 2.7 Ratio 04/28/2008 2141   VLDL 10 04/28/2008 2141   LDLCALC 89 05/16/2022 1441   LDLDIRECT 76 09/25/2020 0840    Clinical Atherosclerotic Cardiovascular Disease (ASCVD): No  The 10-year ASCVD risk score (Arnett DK, et al., 2019) is: 9.1%   Values used to calculate the score:     Age: 60 years     Clincally relevant sex: Female     Is Non-Hispanic African American: Yes     Diabetic: Yes     Tobacco smoker: No     Systolic Blood Pressure: 133 mmHg  Is BP treated: Yes     HDL Cholesterol: 41 mg/dL     Total Cholesterol: 145 mg/dL   Patient is participating in a Managed Medicaid Plan:  Yes   A/P: Diabetes longstanding diagnosed in 08/2020 and history of gestational diabetes with last of 3 children. Much improved control with morning glucose readings in the mid-lower 100s. Patient is able to verbalize appropriate hypoglycemia management plan. Medication adherence appears good.  - Continued Lantus  (insulin  glargine) at 18 units daily.  - Continued metformin  XR 1000mg  once daily in the AM - Discussed options of both GLP and SGLT2 therapy at future visit  UACR was 299 at check recent visit - Patient educated extensively on  pathophysiology of diabetes, recommended lifestyle interventions, dietary effects on blood sugar control.  -Next A1c anticipated 3 month CGM visit in early October.    Hypertension longstanding and currently near goal of <130/80 mmHg. Medication adherence good. Blood pressure control is improved likely related to less body stress of hyperglycemia and joint pains than last visit.  -Continue amlodipine  10mg , and losartan  100mg  Reassess at next visit  Written patient instructions provided. Patient verbalized understanding of treatment plan.  Total time in face to face counseling 23 minutes.    Follow-up:  Pharmacist TBD PCP clinic visit in tomorrow with Dr. Lafe - determine follow-up with pharmacy team at that time (suggest 4-5 weeks in the future)

## 2023-09-26 ENCOUNTER — Ambulatory Visit: Admitting: Family Medicine

## 2023-09-26 ENCOUNTER — Encounter: Payer: Self-pay | Admitting: Family Medicine

## 2023-09-26 VITALS — BP 138/86 | HR 73 | Ht 66.0 in | Wt 184.4 lb

## 2023-09-26 DIAGNOSIS — Z0000 Encounter for general adult medical examination without abnormal findings: Secondary | ICD-10-CM | POA: Diagnosis not present

## 2023-09-26 DIAGNOSIS — M546 Pain in thoracic spine: Secondary | ICD-10-CM | POA: Diagnosis not present

## 2023-09-26 NOTE — Patient Instructions (Addendum)
 It was so good to see you today! Thank you for allowing me to take care of you.  Today we discussed the following concerns and plans:  You had your annual physical today - you are doing well. Keep up the good work!  Shoulder/back pain Your referral has been sent to: [facility]   [address]   [phone]  Please call them to schedule. If you have any issues please let me know.  If you have any concerns, please call the clinic or schedule an appointment.  It was a pleasure to take care of you today. Be well!  Lauraine Norse, DO [facility], PGY-2  Do you need your medications delivered to your home?   We'll send your prescription to the [facility] for delivery.          Address: [address] [place], [place], [place] [address]          Phone: [phone]  Please call the [facility] to speak with a pharmacist and set up your home medication delivery. If you have any questions, feel free to contact us  -- we're happy to help!  Other  Pharmacies that offer affordable prices on both prescriptions and over-the-counter items, as well as convenient services like vaccinations, are  [facility], at [facility]         Address:  [address], [place], [place] [address]         Phone: [phone]  [facility], located in the [facility]        Address: [address] [place], [place] [address]        Phone: [phone]  [facility], at [facility]       Address: [address], [place], [place] [address]       Phone: [phone]  [facility], at [facility]       Address: [address], [address] Floor, [place], [place] [address]       Phone: [phone]

## 2023-09-26 NOTE — Progress Notes (Signed)
    SUBJECTIVE:   Chief compliant/HPI: annual examination  Jessica Herman is a 48 y.o. who presents today for an annual exam.   History tabs reviewed.  Review of systems form reviewed and notable for ongoing, but improving, constipation; she has discussed with GI and is on appropriate regimen at this time.   OBJECTIVE:   BP 138/86   Pulse 73   Ht 5' 6 (1.676 m)   Wt 184 lb 6 oz (83.6 kg)   LMP 08/06/2023 (Approximate)   SpO2 100%   BMI 29.76 kg/m   General: well-appearing, pleasant, no acute distress. HEENT: normocephalic, PERRLA, EOM grossly intact, MMM. No LAD, neck is supple. Cardio: Regular rate, regular rhythm, no murmurs on exam. Pulm: Clear, no wheezing, no crackles. No increased work of breathing. Abdominal: bowel sounds present, soft, non-tender, non-distended. Extremities: no peripheral edema. Moves all extremities equally. Neuro: Alert and oriented x3, speech normal in content, no facial asymmetry. Psych:  Cognition and judgment appear intact. Alert, communicative, and cooperative.   ASSESSMENT/PLAN:   Assessment & Plan Annual physical exam Continue all current medications, no changes indicated today. - amlodipine  10 mg daily, Lipitor 40 mg daily, losartan  100 mg daily - metformin  500 mg daily, lantus  18 U daily Right-sided thoracic back pain, unspecified chronicity Pain is stable today. Patient was referred at previous visit and has not yet heard from Orthopedics. - contact info given so pt may reach out to office for scheduling. Annual Examination  See AVS for age appropriate recommendations.   PHQ score negative, reviewed and discussed.  Blood pressure reviewed and at goal.   Considered the following items based upon USPSTF recommendations: Diabetes screening: not due. Next A1c in ~3 months. Screening for elevated cholesterol: not due HIV testing: NR in 2022 Not ordered. Hepatitis C: Negative 2022 Not ordered. Hepatitis B: discussed Not  ordered. Reviewed risk factors for latent tuberculosis and not indicated, recently completed.  Discussed family history, BRCA testing discussed, deferred. Cervical cancer screening: prior Pap reviewed, repeat due in 2027 Colorectal cancer screening: Has seen GI and is scheduled for this.  Follow up in 1 year or sooner if indicated.  MyChart Activation: Already signed up  Lauraine Norse, DO [facility]

## 2023-09-26 NOTE — Progress Notes (Signed)
 Reviewed and agree with Dr Macky Lower plan.

## 2023-10-10 ENCOUNTER — Encounter: Payer: Self-pay | Admitting: Gastroenterology

## 2023-10-10 ENCOUNTER — Ambulatory Visit: Admitting: Gastroenterology

## 2023-10-10 VITALS — BP 178/94 | HR 93 | Temp 98.3°F | Resp 13 | Ht 66.0 in | Wt 186.0 lb

## 2023-10-10 DIAGNOSIS — Q438 Other specified congenital malformations of intestine: Secondary | ICD-10-CM

## 2023-10-10 DIAGNOSIS — K648 Other hemorrhoids: Secondary | ICD-10-CM | POA: Diagnosis not present

## 2023-10-10 DIAGNOSIS — Z1211 Encounter for screening for malignant neoplasm of colon: Secondary | ICD-10-CM | POA: Diagnosis present

## 2023-10-10 MED ORDER — SODIUM CHLORIDE 0.9 % IV SOLN: 500.0000 mL | Freq: Once | INTRAVENOUS | Status: DC

## 2023-10-10 NOTE — Op Note (Signed)
  [facility] Patient Name: Jessica Herman Procedure Date: 10/10/2023 2:54 PM MRN: [id number] Endoscopist: Victory L. Legrand , MD, [id number] Age: 48 Referring MD:  Date of Birth: 1975/03/21 Gender: Female Account #: [id number] Procedure:                Colonoscopy Indications:              Screening for colorectal malignant neoplasm, This                            is the patient's first colonoscopy Medicines:                Monitored Anesthesia Care Procedure:                Pre-Anesthesia Assessment:                           - Prior to the procedure, a History and Physical                            was performed, and patient medications and                            allergies were reviewed. The patient's tolerance of                            previous anesthesia was also reviewed. The risks                            and benefits of the procedure and the sedation                            options and risks were discussed with the patient.                            All questions were answered, and informed consent                            was obtained. Prior Anticoagulants: The patient has                            taken no anticoagulant or antiplatelet agents. ASA                            Grade Assessment: II - A patient with mild systemic                            disease. After reviewing the risks and benefits,                            the patient was deemed in satisfactory condition to                            undergo the procedure.  After obtaining informed consent, the colonoscope                            was passed under direct vision. Throughout the                            procedure, the patient's blood pressure, pulse, and                            oxygen saturations were monitored continuously. The                            Olympus Scope [id number] was introduced through the                            anus and advanced to  the the cecum, identified by                            appendiceal orifice and ileocecal valve. The                            colonoscopy was somewhat difficult due to a                            redundant colon. Successful completion of the                            procedure was aided by changing the patient to a                            semi-prone position, using manual pressure and                            straightening and shortening the scope to obtain                            bowel loop reduction. The patient tolerated the                            procedure well. The quality of the bowel                            preparation was good. The ileocecal valve,                            appendiceal orifice, and rectum were photographed. Scope In: 2:59:40 PM Scope Out: 3:17:31 PM Scope Withdrawal Time: 0 hours 9 minutes 0 seconds  Total Procedure Duration: 0 hours 17 minutes 51 seconds  Findings:                 The perianal and digital rectal examinations were                            normal.  Repeat examination of right colon under NBI                            performed.                           Internal hemorrhoids were found. The hemorrhoids                            were small.                           The exam was otherwise without abnormality on                            direct and retroflexion views. Complications:            No immediate complications. Estimated Blood Loss:     Estimated blood loss: none. Impression:               - The entire examined colon is normal on direct and                            retroflexion views.                           - No specimens collected. Recommendation:           - Patient has a contact number available for                            emergencies. The signs and symptoms of potential                            delayed complications were discussed with the                            patient.  Return to normal activities tomorrow.                            Written discharge instructions were provided to the                            patient.                           - Resume previous diet.                           - Continue present medications.                           - Repeat colonoscopy in 10 years for screening                            purposes. Kristyana Notte L. Legrand, MD 10/10/2023 3:20:52 PM This report has been signed electronically.

## 2023-10-10 NOTE — Patient Instructions (Signed)
 Resume previous diet. Continue present medications. Repeat colonoscopy in 10 years for screening purposes. Handout provided on hemorrhoids.   YOU HAD AN ENDOSCOPIC PROCEDURE TODAY AT THE [facility]:   Refer to the procedure report that was given to you for any specific questions about what was found during the examination.  If the procedure report does not answer your questions, please call your gastroenterologist to clarify.  If you requested that your care partner not be given the details of your procedure findings, then the procedure report has been included in a sealed envelope for you to review at your convenience later.  YOU SHOULD EXPECT: Some feelings of bloating in the abdomen. Passage of more gas than usual.  Walking can help get rid of the air that was put into your GI tract during the procedure and reduce the bloating. If you had a lower endoscopy (such as a colonoscopy or flexible sigmoidoscopy) you may notice spotting of blood in your stool or on the toilet paper. If you underwent a bowel prep for your procedure, you may not have a normal bowel movement for a few days.  Please Note:  You might notice some irritation and congestion in your nose or some drainage.  This is from the oxygen used during your procedure.  There is no need for concern and it should clear up in a day or so.  SYMPTOMS TO REPORT IMMEDIATELY:  Following lower endoscopy (colonoscopy or flexible sigmoidoscopy):  Excessive amounts of blood in the stool  Significant tenderness or worsening of abdominal pains  Swelling of the abdomen that is new, acute  Fever of 100F or higher  For urgent or emergent issues, a gastroenterologist can be reached at any hour by calling [phone]. Do not use MyChart messaging for urgent concerns.    DIET:  We do recommend a small meal at first, but then you may proceed to your regular diet.  Drink plenty of fluids but you should avoid alcoholic beverages for 24  hours.  ACTIVITY:  You should plan to take it easy for the rest of today and you should NOT DRIVE or use heavy machinery until tomorrow (because of the sedation medicines used during the test).    FOLLOW UP: Our staff will call the number listed on your records the next business day following your procedure.  We will call around 7:15- 8:00 am to check on you and address any questions or concerns that you may have regarding the information given to you following your procedure. If we do not reach you, we will leave a message.     If any biopsies were taken you will be contacted by phone or by letter within the next 1-3 weeks.  Please call us  at [phone] if you have not heard about the biopsies in 3 weeks.    SIGNATURES/CONFIDENTIALITY: You and/or your care partner have signed paperwork which will be entered into your electronic medical record.  These signatures attest to the fact that that the information above on your After Visit Summary has been reviewed and is understood.  Full responsibility of the confidentiality of this discharge information lies with you and/or your care-partner.
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
## 2023-10-10 NOTE — Progress Notes (Signed)
 Sedate, gd SR, tolerated procedure well, VSS, report to RN

## 2023-10-13 ENCOUNTER — Telehealth: Payer: Self-pay | Admitting: *Deleted

## 2023-10-13 NOTE — Telephone Encounter (Signed)
  Follow up Call-     10/10/2023    1:54 PM  Call back number  Post procedure Call Back phone  # [phone]  Permission to leave phone message Yes   Left message to call back if any questions or concerns

## 2023-11-12 ENCOUNTER — Other Ambulatory Visit: Payer: Self-pay | Admitting: Family Medicine

## 2023-11-27 ENCOUNTER — Other Ambulatory Visit: Payer: Self-pay | Admitting: Family Medicine

## 2023-11-27 DIAGNOSIS — E119 Type 2 diabetes mellitus without complications: Secondary | ICD-10-CM

## 2023-12-04 ENCOUNTER — Other Ambulatory Visit: Payer: Self-pay

## 2023-12-04 DIAGNOSIS — E119 Type 2 diabetes mellitus without complications: Secondary | ICD-10-CM

## 2023-12-04 MED ORDER — FREESTYLE LIBRE 3 PLUS SENSOR MISC: 3 refills | Status: AC

## 2024-02-26 ENCOUNTER — Other Ambulatory Visit: Payer: Self-pay | Admitting: Family Medicine

## 2024-03-24 ENCOUNTER — Telehealth: Payer: Self-pay | Admitting: Pharmacist

## 2024-03-24 NOTE — Telephone Encounter (Signed)
 Attempted to contact patient for follow-up of diabetes control.  Previously using Lantus  (insulin  glargine) with CGM (last data 12/2023 was excellent control - GMI 6.0)   Left HIPAA compliant voice mail requesting call back to  My direct phone: [phone] if she is in need of new prescription for sensors. Or Main number to reschedule: [phone]  Total time with patient call and documentation of interaction: 11 minutes.

## 2024-03-24 NOTE — Telephone Encounter (Signed)
 Reviewed and agree with Dr Rennis plan.
